# Patient Record
Sex: Female | Born: 1945 | Race: White | Hispanic: No | Marital: Married | Smoking: Former smoker
Health system: Southern US, Community
[De-identification: ages and names within clinical notes are randomized; demographics above are authoritative.]

## PROBLEM LIST (undated history)

## (undated) DIAGNOSIS — D649 Anemia, unspecified: Secondary | ICD-10-CM

## (undated) DIAGNOSIS — F32A Depression, unspecified: Secondary | ICD-10-CM

## (undated) DIAGNOSIS — R112 Nausea with vomiting, unspecified: Secondary | ICD-10-CM

## (undated) DIAGNOSIS — I2510 Atherosclerotic heart disease of native coronary artery without angina pectoris: Secondary | ICD-10-CM

## (undated) DIAGNOSIS — C50919 Malignant neoplasm of unspecified site of unspecified female breast: Secondary | ICD-10-CM

## (undated) DIAGNOSIS — E039 Hypothyroidism, unspecified: Secondary | ICD-10-CM

## (undated) DIAGNOSIS — Z889 Allergy status to unspecified drugs, medicaments and biological substances status: Secondary | ICD-10-CM

## (undated) DIAGNOSIS — Z9889 Other specified postprocedural states: Secondary | ICD-10-CM

## (undated) DIAGNOSIS — I10 Essential (primary) hypertension: Secondary | ICD-10-CM

## (undated) DIAGNOSIS — M1990 Unspecified osteoarthritis, unspecified site: Secondary | ICD-10-CM

## (undated) DIAGNOSIS — E785 Hyperlipidemia, unspecified: Secondary | ICD-10-CM

## (undated) DIAGNOSIS — F329 Major depressive disorder, single episode, unspecified: Secondary | ICD-10-CM

## (undated) HISTORY — PX: ORIF WRIST FRACTURE: SHX2133

## (undated) HISTORY — PX: LAPAROSCOPY: SHX197

## (undated) HISTORY — DX: Hyperlipidemia, unspecified: E78.5

## (undated) HISTORY — DX: Atherosclerotic heart disease of native coronary artery without angina pectoris: I25.10

## (undated) HISTORY — PX: KNEE SURGERY: SHX244

## (undated) HISTORY — PX: TONSILLECTOMY: SUR1361

## (undated) HISTORY — PX: ABDOMINAL HYSTERECTOMY: SHX81

## (undated) HISTORY — DX: Malignant neoplasm of unspecified site of unspecified female breast: C50.919

---

## 1999-05-28 ENCOUNTER — Ambulatory Visit (HOSPITAL_BASED_OUTPATIENT_CLINIC_OR_DEPARTMENT_OTHER): Admission: RE | Admit: 1999-05-28 | Discharge: 1999-05-28 | Payer: Self-pay | Admitting: Orthopedic Surgery

## 1999-12-16 ENCOUNTER — Other Ambulatory Visit: Admission: RE | Admit: 1999-12-16 | Discharge: 1999-12-16 | Payer: Self-pay | Admitting: *Deleted

## 2000-03-23 ENCOUNTER — Encounter: Admission: RE | Admit: 2000-03-23 | Discharge: 2000-03-23 | Payer: Self-pay | Admitting: *Deleted

## 2000-03-23 ENCOUNTER — Encounter: Payer: Self-pay | Admitting: *Deleted

## 2001-01-17 ENCOUNTER — Other Ambulatory Visit: Admission: RE | Admit: 2001-01-17 | Discharge: 2001-01-17 | Payer: Self-pay | Admitting: *Deleted

## 2001-04-23 ENCOUNTER — Encounter: Admission: RE | Admit: 2001-04-23 | Discharge: 2001-04-23 | Payer: Self-pay | Admitting: Internal Medicine

## 2001-04-23 ENCOUNTER — Encounter: Payer: Self-pay | Admitting: Internal Medicine

## 2002-01-29 ENCOUNTER — Other Ambulatory Visit: Admission: RE | Admit: 2002-01-29 | Discharge: 2002-01-29 | Payer: Self-pay | Admitting: *Deleted

## 2002-04-24 ENCOUNTER — Encounter: Payer: Self-pay | Admitting: Internal Medicine

## 2002-04-24 ENCOUNTER — Encounter: Admission: RE | Admit: 2002-04-24 | Discharge: 2002-04-24 | Payer: Self-pay | Admitting: Internal Medicine

## 2003-02-06 ENCOUNTER — Other Ambulatory Visit: Admission: RE | Admit: 2003-02-06 | Discharge: 2003-02-06 | Payer: Self-pay | Admitting: *Deleted

## 2003-04-29 ENCOUNTER — Encounter: Admission: RE | Admit: 2003-04-29 | Discharge: 2003-04-29 | Payer: Self-pay | Admitting: Internal Medicine

## 2003-04-29 ENCOUNTER — Encounter: Payer: Self-pay | Admitting: Internal Medicine

## 2004-03-11 ENCOUNTER — Other Ambulatory Visit: Admission: RE | Admit: 2004-03-11 | Discharge: 2004-03-11 | Payer: Self-pay | Admitting: *Deleted

## 2004-04-29 ENCOUNTER — Encounter: Admission: RE | Admit: 2004-04-29 | Discharge: 2004-04-29 | Payer: Self-pay | Admitting: *Deleted

## 2004-09-15 ENCOUNTER — Ambulatory Visit (HOSPITAL_COMMUNITY): Admission: RE | Admit: 2004-09-15 | Discharge: 2004-09-15 | Payer: Self-pay | Admitting: Gastroenterology

## 2005-03-23 ENCOUNTER — Other Ambulatory Visit: Admission: RE | Admit: 2005-03-23 | Discharge: 2005-03-23 | Payer: Self-pay | Admitting: *Deleted

## 2005-05-02 ENCOUNTER — Encounter: Admission: RE | Admit: 2005-05-02 | Discharge: 2005-05-02 | Payer: Self-pay | Admitting: *Deleted

## 2006-04-25 ENCOUNTER — Other Ambulatory Visit: Admission: RE | Admit: 2006-04-25 | Discharge: 2006-04-25 | Payer: Self-pay | Admitting: *Deleted

## 2006-05-04 ENCOUNTER — Encounter: Admission: RE | Admit: 2006-05-04 | Discharge: 2006-05-04 | Payer: Self-pay | Admitting: Internal Medicine

## 2007-05-08 ENCOUNTER — Other Ambulatory Visit: Admission: RE | Admit: 2007-05-08 | Discharge: 2007-05-08 | Payer: Self-pay | Admitting: *Deleted

## 2007-05-09 ENCOUNTER — Encounter: Admission: RE | Admit: 2007-05-09 | Discharge: 2007-05-09 | Payer: Self-pay | Admitting: Internal Medicine

## 2008-05-08 ENCOUNTER — Emergency Department (HOSPITAL_COMMUNITY): Admission: EM | Admit: 2008-05-08 | Discharge: 2008-05-08 | Payer: Self-pay | Admitting: Emergency Medicine

## 2008-05-08 ENCOUNTER — Other Ambulatory Visit: Admission: RE | Admit: 2008-05-08 | Discharge: 2008-05-08 | Payer: Self-pay | Admitting: Gynecology

## 2008-05-13 ENCOUNTER — Encounter: Admission: RE | Admit: 2008-05-13 | Discharge: 2008-05-13 | Payer: Self-pay | Admitting: Internal Medicine

## 2008-05-14 ENCOUNTER — Ambulatory Visit (HOSPITAL_COMMUNITY): Admission: RE | Admit: 2008-05-14 | Discharge: 2008-05-15 | Payer: Self-pay | Admitting: Orthopaedic Surgery

## 2009-05-14 ENCOUNTER — Encounter: Admission: RE | Admit: 2009-05-14 | Discharge: 2009-05-14 | Payer: Self-pay | Admitting: Gynecology

## 2009-08-06 IMAGING — MG MM SCREEN MAMMOGRAM BILATERAL
4 series · 4 of 4 positions shown · non-contrast
Comparison: none

DG SCREEN MAMMOGRAM BILATERAL
Bilateral CC and MLO view(s) were taken.
Technologist: Shamil Jesus

DIGITAL SCREENING MAMMOGRAM WITH CAD:
There are scattered fibroglandular densities.  No masses or malignant type calcifications are 
identified.  Compared with prior studies.

[R CC]
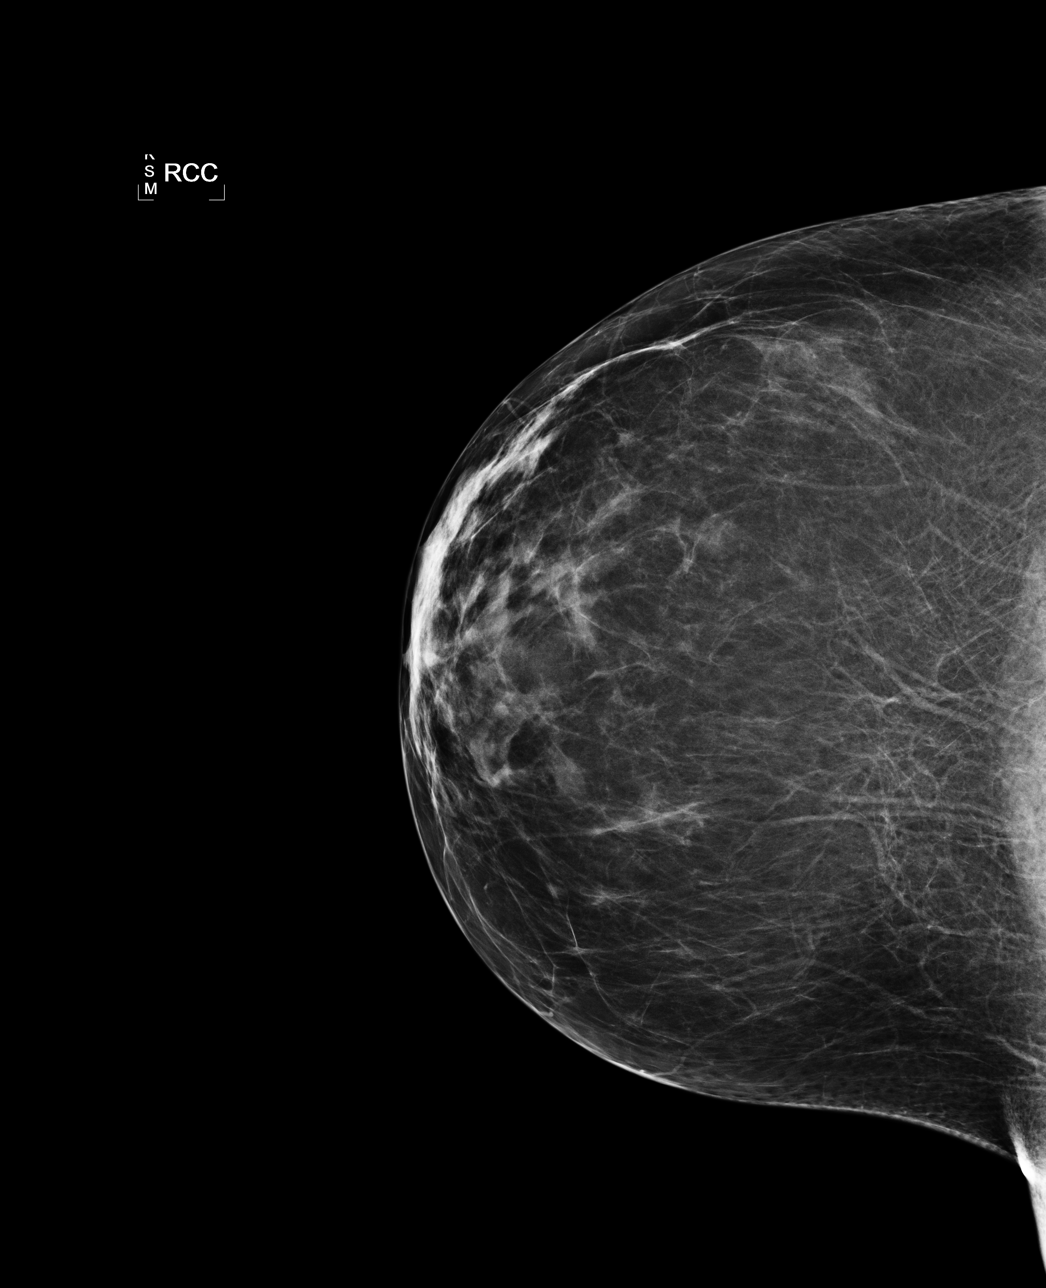

[L CC]
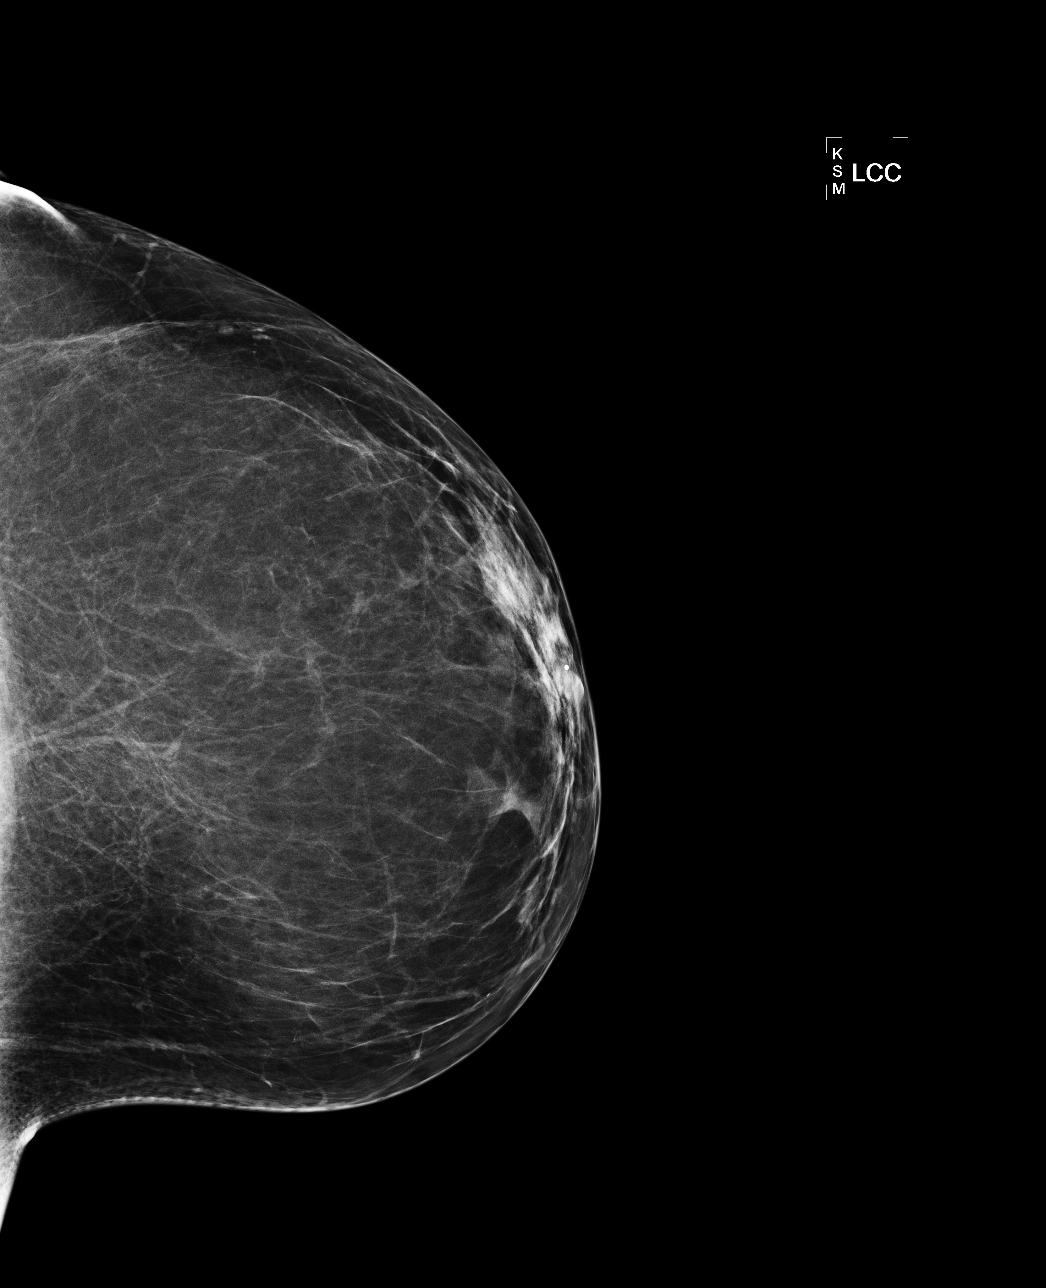

[L MLO]
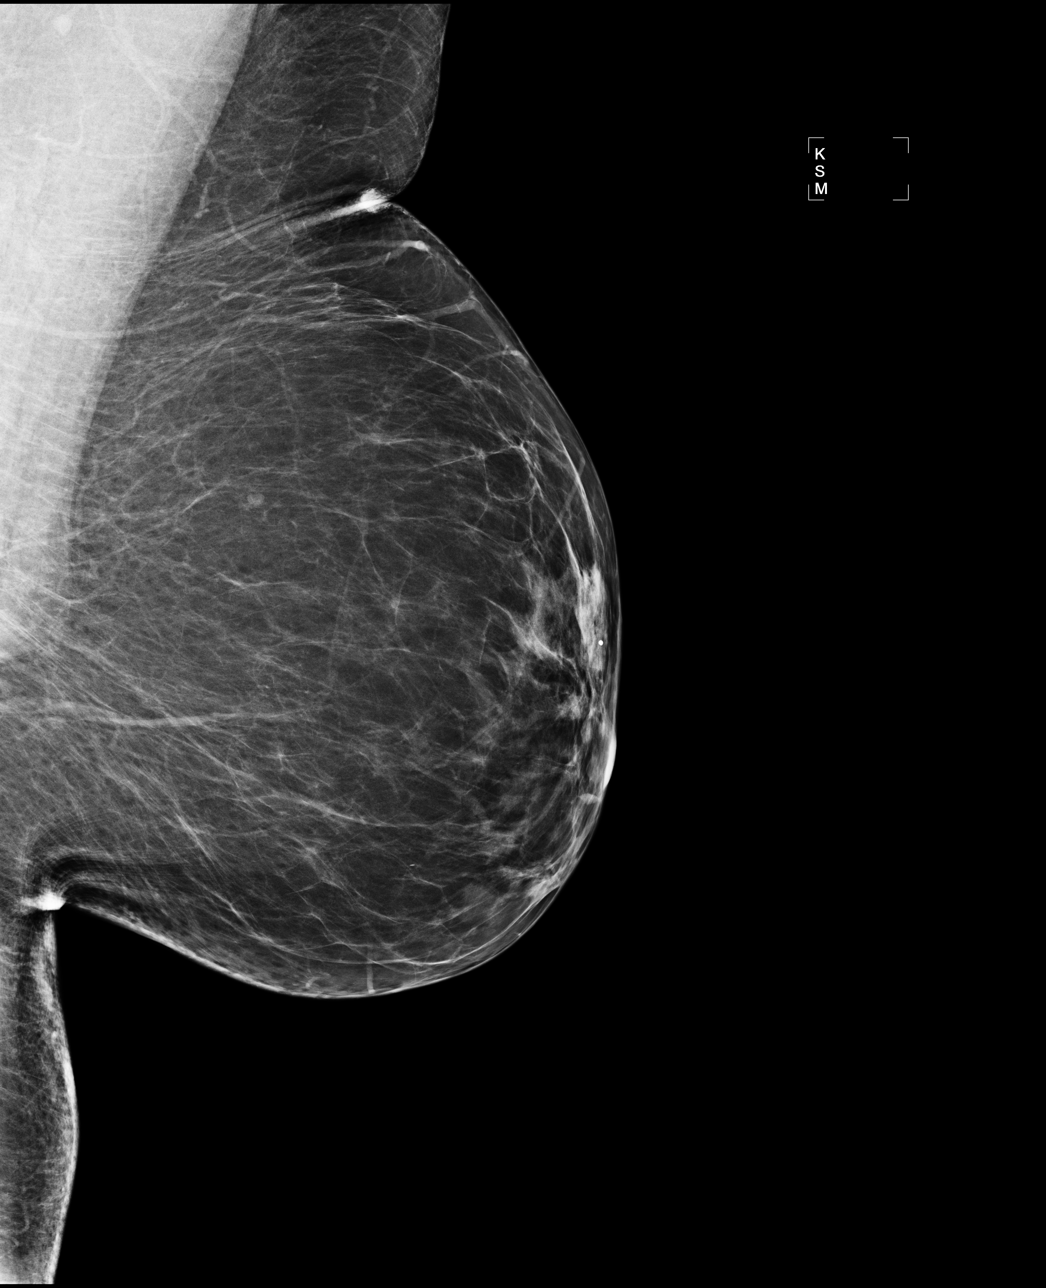

[R MLO]
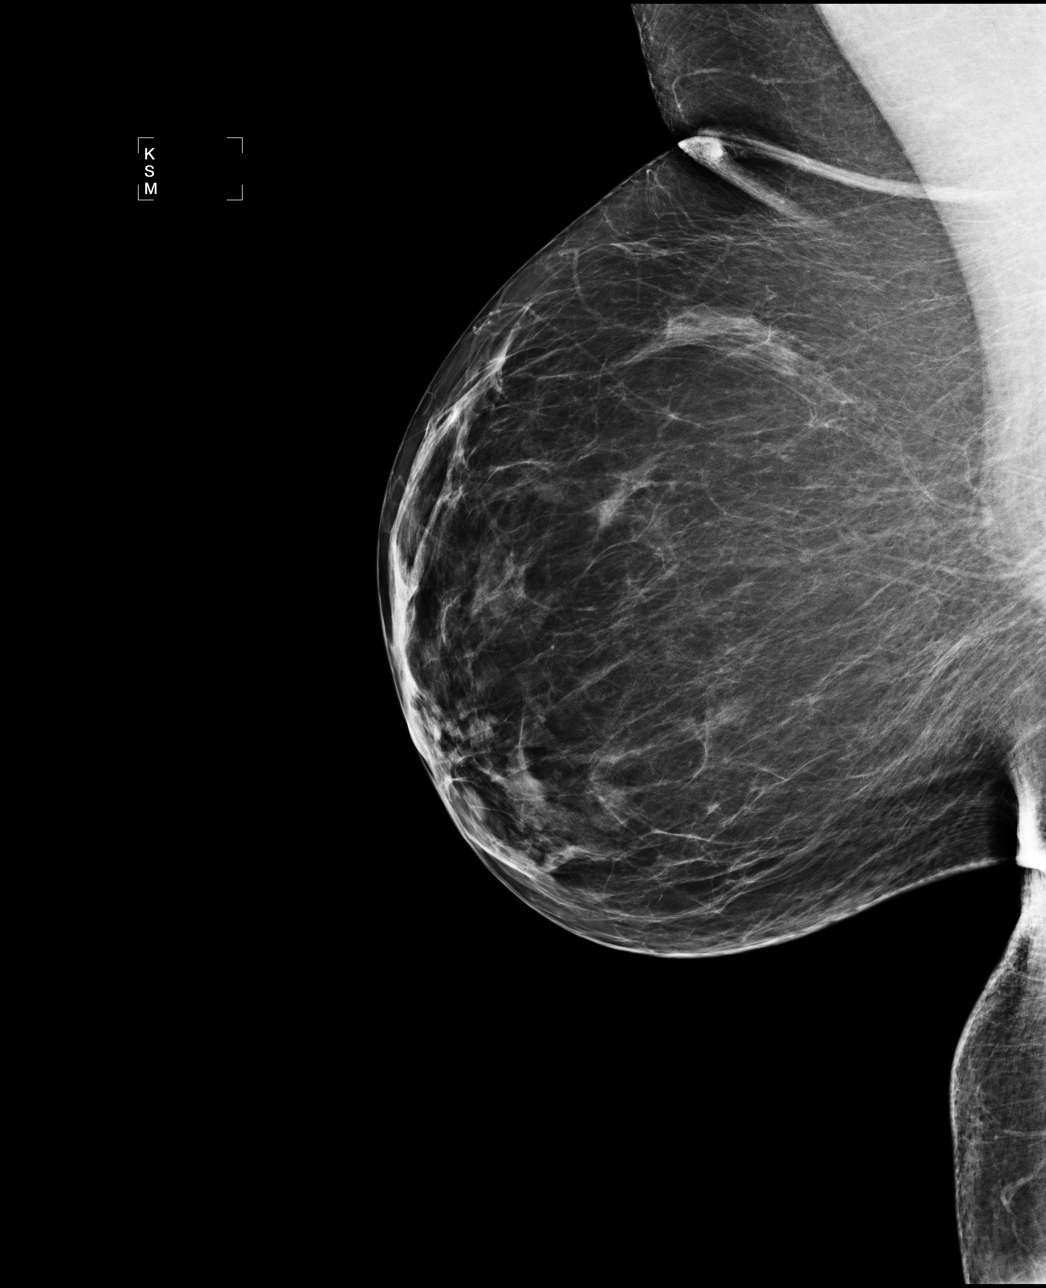

[4 of 4 positions shown; findings below may reference images not displayed]

IMPRESSION: No specific mammographic evidence of malignancy.  Next screening mammogram is recommended in one 
year.

ASSESSMENT: Negative - BI-RADS 1

Screening mammogram in 1 year.
ANALYZED BY COMPUTER AIDED DETECTION. , THIS PROCEDURE WAS A DIGITAL MAMMOGRAM.

## 2009-08-07 IMAGING — CR DG CHEST 2V
2 series · 2 of 2 positions shown · non-contrast
Comparison: None

CLINICAL DATA: Preop radiograph.

CHEST - 2 VIEW

[view not recorded (1 of 2)]
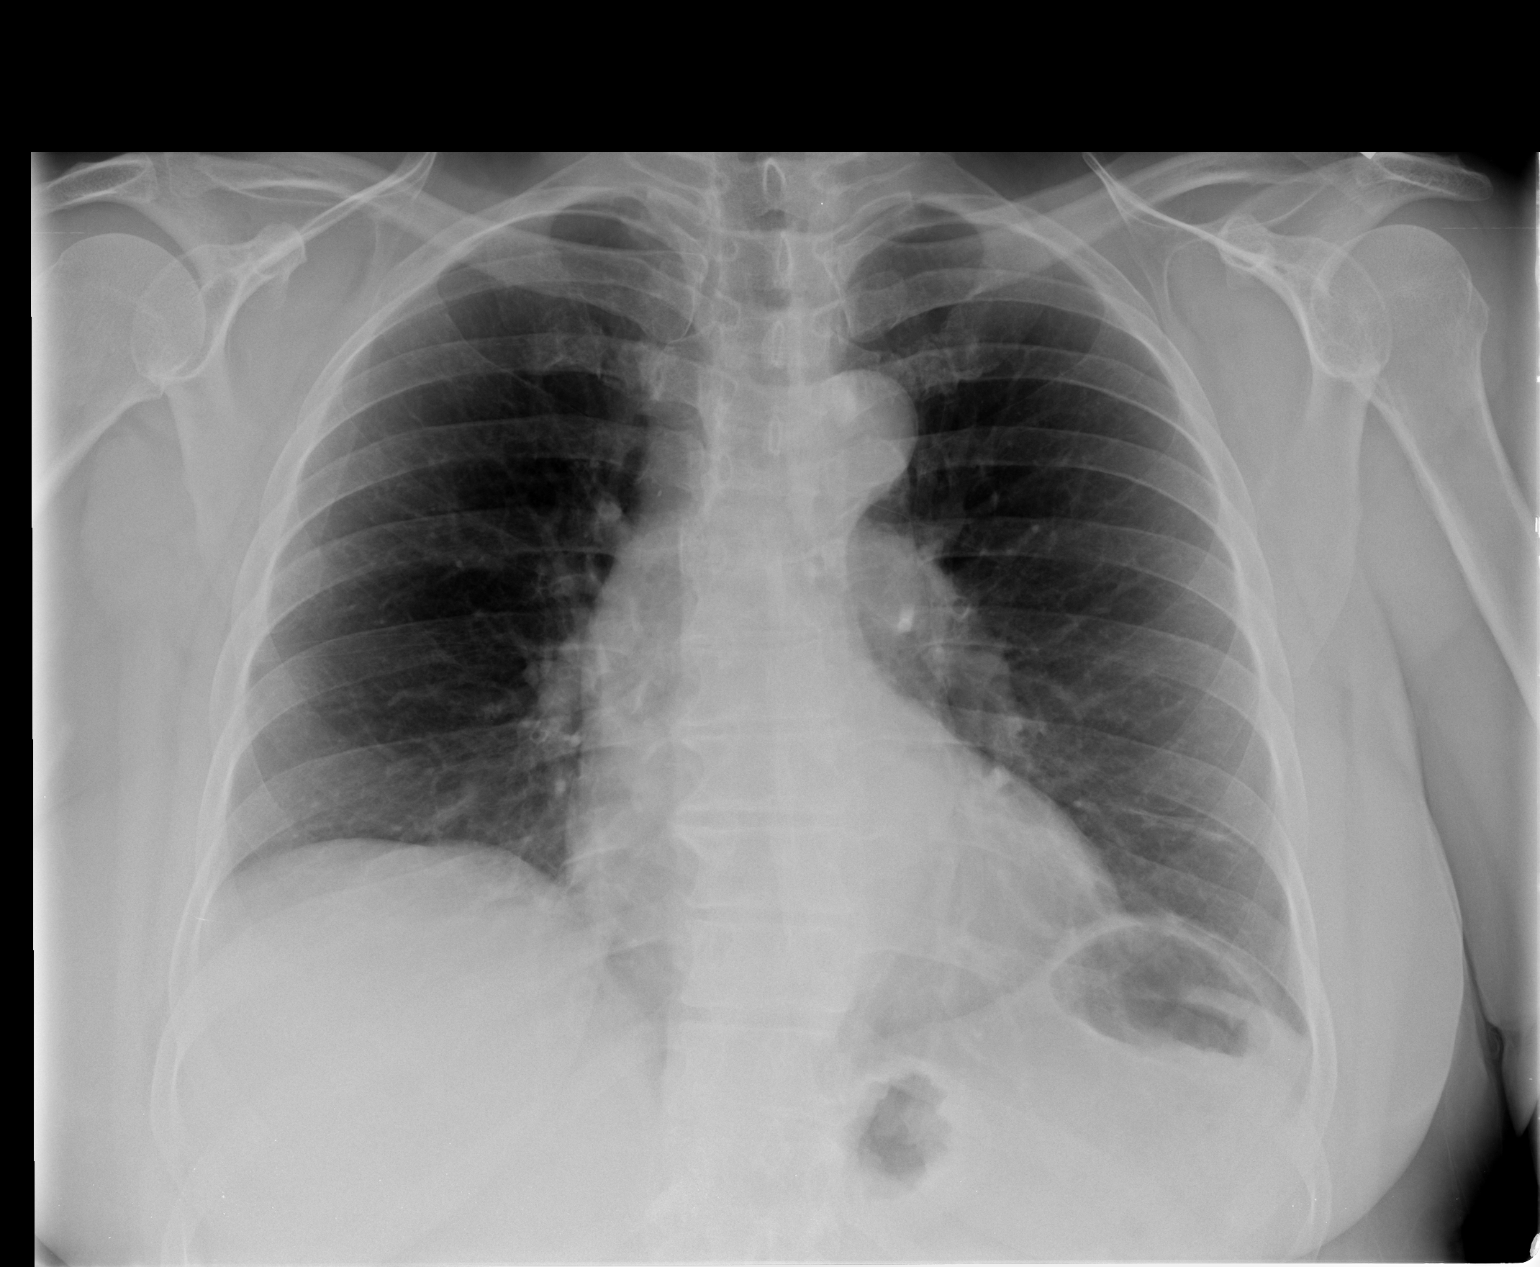

[view not recorded (2 of 2)]
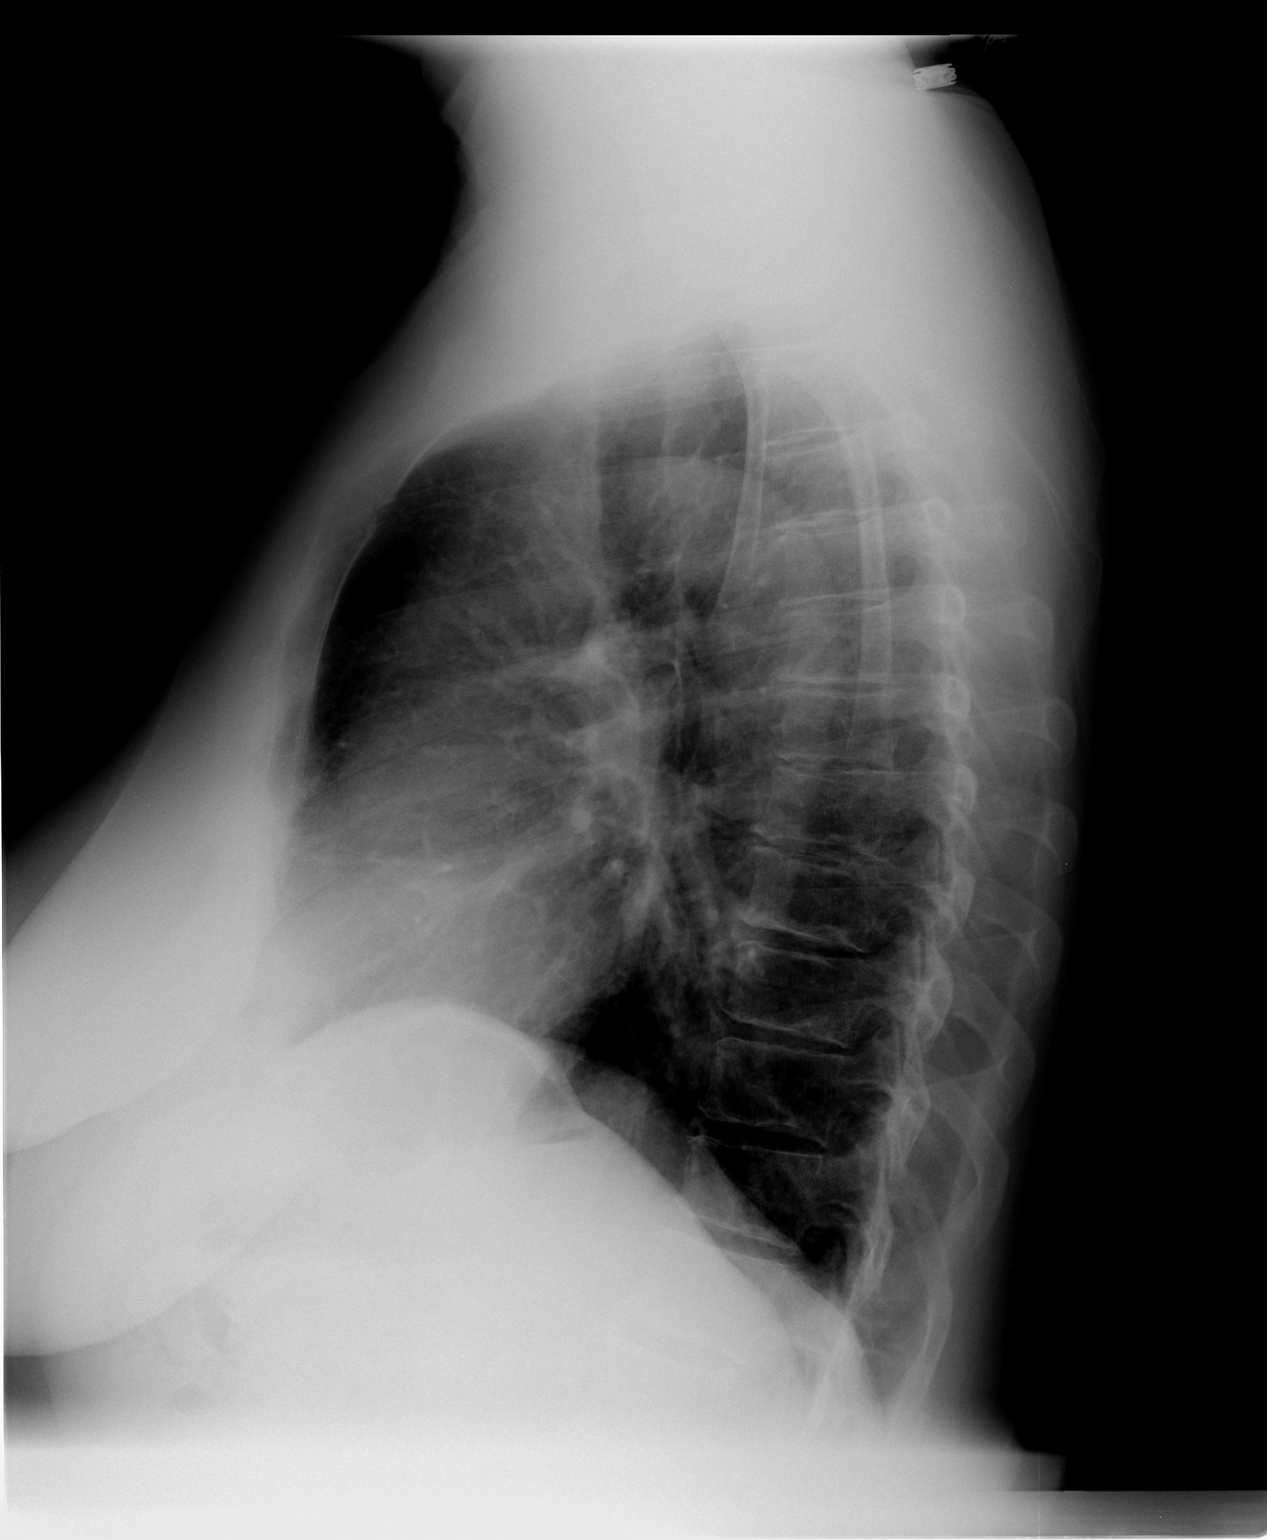

[2 of 2 positions shown; findings below may reference images not displayed]

FINDINGS: There is a mild scarring within the lingular portion of
the left lung.

Heart size and mediastinal contours are normal.

No pleural effusion or pulmonary interstitial edema.

No airspace densities identified.
IMPRESSION: 1.  Left base scarring.
2.  No acute findings the

## 2010-05-17 ENCOUNTER — Encounter: Admission: RE | Admit: 2010-05-17 | Discharge: 2010-05-17 | Payer: Self-pay | Admitting: Gynecology

## 2011-01-18 NOTE — Op Note (Signed)
NAME:  Meghan Hoffman, Meghan Hoffman NO.:  [id number]   MEDICAL RECORD NO.:  [id number]          PATIENT TYPE:  OIB   LOCATION:  5033                         FACILITY:  [facility]   PHYSICIAN:  Mark C. Ophelia Charter, M.D.    DATE OF BIRTH:  May 12, 1946   DATE OF PROCEDURE:  05/14/2008  DATE OF DISCHARGE:                               OPERATIVE REPORT   PREOPERATIVE DIAGNOSIS:  Comminuted right distal radius fracture.   POSTOPERATIVE DIAGNOSIS:  Comminuted right distal radius fracture.   ORIF right distal radius (more than 3 distal fragments).   SURGEON:  Mark C. Ophelia Charter, MD   ASSISTANT:  Wende Neighbors, PA-C   ANESTHESIA:  IV sedation with block.   TOURNIQUET TIME:  Less than an hour and half and deflated time is 15  minutes and up time is 30 minutes.   DRAINS:  None.   INDICATIONS:  This 65 year old female fell with severely comminuted  distal radius fracture with multiple fragments and dorsal comminution,  volar comminution, depression of the styloid fracture, and involvement  of the distal RU joint with the lunate fragment separate.  She is  brought in for operative stabilization.   PROCEDURE:  After induction of either scalene or axillary block  anesthesia, the patient had numbness in the right upper extremity.  She  received some IV sedation and proximal arm tourniquet was applied,  standard prep and drape was performed with DuraPrep.  Preoperative  antibiotics were given.  Time-out checklist procedure was completed.  Incision was made over the flexor carpi radialis splitting the sheath  posteriorly, pulling the pronator from the radial aspect toward the ulna  with subperiosteal section of the fracture site.  There was significant  comminution.  Reduction was performed.  Checking under fluoroscopy,  there was still some slight displacement of the styloid fragment.  K-  wire was used initially and then a second for reduction of plate was  initially fashioned, placed with  rotation to check spot pictures after  some distal row screws were inserted.  Styloid piece came loose and  there was depression of the articular fragment of the styloid.  Plate  screws were removed and bone graft was selected, 10 mL and packed  meticulously into the fracture site with distraction pushing the  articular surface fragments up.  This was cancellous bone chips.  With  improvement of the position of the articular surface, plate was  reapplied and a screw holes were then filled.  The distal screws had to  be redrilled and the distal-most row on the ulnar aspect screw would not  lock in completely and was left empty.  All of the screw holes were  filled and the styloid fragment was threaded, all of the pegs were  smooth.  Due to the comminution of the styloid, 2 percutaneous K-wires  were placed by stab incision, mosquito spreading and then placement of  the 2 K-wires for stabilization of the styloid piece.  Fluoroscopic spot  pictures were taken, which showed significant improvement in the  position and alignment.  There was restoration of the  tilt of the radius  and regional approximation within a mm of the distal articular surface  on AP x-ray.  After irrigation of saline solution, skin was closed with  3-0 nylon, pins were bent, skin was released.  Pin caps  applied.  Marcaine infiltration in the skin, 4x4s after Xeroform twiners  between the fingers, and then splint application with plaster, Webril,  and then Ace wrap.  The patient tolerated the procedure well and was  transferred to recovery room in stable condition.  Instrument count and  needle count was correct.      Mark C. Ophelia Charter, M.D.  Electronically Signed     MCY/MEDQ  D:  05/14/2008  T:  05/15/2008  Job:  [id number]

## 2011-01-21 NOTE — Op Note (Signed)
NAME:  KASHIA, BROSSARD NO.:  [id number]   MEDICAL RECORD NO.:  [id number]          PATIENT TYPE:  AMB   LOCATION:  ENDO                         FACILITY:  [facility]   PHYSICIAN:  Danise Edge, M.D.   DATE OF BIRTH:  [birth year]   DATE OF PROCEDURE:  09/15/2004  DATE OF DISCHARGE:                                 OPERATIVE REPORT   INDICATIONS FOR PROCEDURE:  Ms Haylei Cobin is a 65 year old female born  Mar 21, 1946.  Ms Klinker underwent a normal health maintenance flexible  proctosigmoidoscopy in 2000.  She is scheduled today to undergo her first  screening colonoscopy with polypectomy to prevent colon cancer.   ENDOSCOPIST:  Danise Edge, M.D.   PREMEDICATION:  Versed 7 mg, Demerol 70 mg.   PROCEDURE:  Colonoscopy.   DESCRIPTION OF PROCEDURE:  After obtaining informed consent, Ms Tritch was  placed in the left lateral decubitus position.  I administered intravenous  Demerol and intravenous Versed to achieve conscious sedation for the  procedure.  The patient's blood pressure, oxygen saturation, and cardiac  rhythm were monitored throughout the procedure and documented in the medical  record.   Anal inspection and digital rectal exam were normal.  The Olympus 2000  pediatric colonoscope was introduced into the rectum and advanced to the  cecum.  Colonic preparation for the exam today was excellent.   Rectum:  Normal.   Sigmoid colon and descending colon:  Normal.   Splenic flexure:  Normal.   Transverse colon:  Normal.   Hepatic flexure:  Normal.   Ascending colon.  Normal.   Cecum and ileocecal valve:  Normal.  The appendiceal orifice and ileocecal  valve were identified.   ASSESSMENT:  Normal screening proctocolonoscopy to the cecum.      MJ/MEDQ  D:  09/15/2004  T:  09/15/2004  Job:  [id number]   cc:   Georgann Housekeeper, MD  [address]  [place]  [place] [address]  Fax: [phone]

## 2011-04-11 ENCOUNTER — Other Ambulatory Visit: Payer: Self-pay | Admitting: Gynecology

## 2011-04-11 DIAGNOSIS — Z1231 Encounter for screening mammogram for malignant neoplasm of breast: Secondary | ICD-10-CM

## 2011-05-23 ENCOUNTER — Ambulatory Visit
Admission: RE | Admit: 2011-05-23 | Discharge: 2011-05-23 | Disposition: A | Discharge status: Home / Self Care | Payer: Managed Care, Other (non HMO) | Source: Ambulatory Visit | Attending: Gynecology | Admitting: Gynecology

## 2011-05-23 DIAGNOSIS — Z1231 Encounter for screening mammogram for malignant neoplasm of breast: Secondary | ICD-10-CM

## 2011-06-08 LAB — COMPREHENSIVE METABOLIC PANEL
ALT: 23
AST: 29
Albumin: 3.4 — ABNORMAL LOW
CO2: 31
Calcium: 9
Chloride: 102
Creatinine, Ser: 0.64
GFR calc Af Amer: >60
GFR calc non Af Amer: >60
Sodium: 139

## 2011-06-08 LAB — CBC
MCHC: 33.8
Platelets: 221
RBC: 4.19
WBC: 4.6

## 2011-06-08 LAB — APTT: aPTT: 31

## 2011-09-22 ENCOUNTER — Other Ambulatory Visit: Payer: Self-pay | Admitting: Internal Medicine

## 2011-09-22 DIAGNOSIS — I714 Abdominal aortic aneurysm, without rupture: Secondary | ICD-10-CM

## 2011-09-23 ENCOUNTER — Ambulatory Visit
Admission: RE | Admit: 2011-09-23 | Discharge: 2011-09-23 | Disposition: A | Discharge status: Home / Self Care | Payer: Managed Care, Other (non HMO) | Source: Ambulatory Visit | Attending: Internal Medicine | Admitting: Internal Medicine

## 2011-09-23 DIAGNOSIS — I714 Abdominal aortic aneurysm, without rupture: Secondary | ICD-10-CM

## 2011-10-25 ENCOUNTER — Other Ambulatory Visit: Payer: Self-pay | Admitting: Internal Medicine

## 2011-10-25 DIAGNOSIS — N281 Cyst of kidney, acquired: Secondary | ICD-10-CM

## 2012-03-26 ENCOUNTER — Ambulatory Visit
Admission: RE | Admit: 2012-03-26 | Discharge: 2012-03-26 | Disposition: A | Discharge status: Home / Self Care | Payer: Medicare Other | Source: Ambulatory Visit | Attending: Internal Medicine | Admitting: Internal Medicine

## 2012-03-26 DIAGNOSIS — N281 Cyst of kidney, acquired: Secondary | ICD-10-CM

## 2012-05-04 ENCOUNTER — Other Ambulatory Visit: Payer: Self-pay | Admitting: Internal Medicine

## 2012-05-04 DIAGNOSIS — Z1231 Encounter for screening mammogram for malignant neoplasm of breast: Secondary | ICD-10-CM

## 2012-05-23 ENCOUNTER — Ambulatory Visit
Admission: RE | Admit: 2012-05-23 | Discharge: 2012-05-23 | Disposition: A | Discharge status: Home / Self Care | Payer: Medicare Other | Source: Ambulatory Visit | Attending: Internal Medicine | Admitting: Internal Medicine

## 2012-05-23 DIAGNOSIS — Z1231 Encounter for screening mammogram for malignant neoplasm of breast: Secondary | ICD-10-CM

## 2012-11-01 ENCOUNTER — Other Ambulatory Visit: Payer: Self-pay | Admitting: Internal Medicine

## 2012-11-01 DIAGNOSIS — N281 Cyst of kidney, acquired: Secondary | ICD-10-CM

## 2013-04-02 ENCOUNTER — Ambulatory Visit
Admission: RE | Admit: 2013-04-02 | Discharge: 2013-04-02 | Disposition: A | Discharge status: Home / Self Care | Payer: Medicare Other | Source: Ambulatory Visit | Attending: Internal Medicine | Admitting: Internal Medicine

## 2013-04-02 DIAGNOSIS — N281 Cyst of kidney, acquired: Secondary | ICD-10-CM

## 2013-05-07 ENCOUNTER — Other Ambulatory Visit: Payer: Self-pay

## 2013-05-07 DIAGNOSIS — Z1231 Encounter for screening mammogram for malignant neoplasm of breast: Secondary | ICD-10-CM

## 2013-05-27 ENCOUNTER — Ambulatory Visit
Admission: RE | Admit: 2013-05-27 | Discharge: 2013-05-27 | Disposition: A | Discharge status: Home / Self Care | Payer: Medicare Other | Source: Ambulatory Visit

## 2013-05-27 DIAGNOSIS — Z1231 Encounter for screening mammogram for malignant neoplasm of breast: Secondary | ICD-10-CM

## 2014-05-05 ENCOUNTER — Other Ambulatory Visit: Payer: Self-pay

## 2014-05-05 DIAGNOSIS — Z1231 Encounter for screening mammogram for malignant neoplasm of breast: Secondary | ICD-10-CM

## 2014-05-28 ENCOUNTER — Ambulatory Visit
Admission: RE | Admit: 2014-05-28 | Discharge: 2014-05-28 | Disposition: A | Discharge status: Home / Self Care | Payer: Medicare Other | Source: Ambulatory Visit

## 2014-05-28 ENCOUNTER — Encounter (INDEPENDENT_AMBULATORY_CARE_PROVIDER_SITE_OTHER): Payer: Self-pay

## 2014-05-28 DIAGNOSIS — Z1231 Encounter for screening mammogram for malignant neoplasm of breast: Secondary | ICD-10-CM

## 2014-09-17 ENCOUNTER — Other Ambulatory Visit: Payer: Self-pay | Admitting: Gastroenterology

## 2014-11-04 ENCOUNTER — Other Ambulatory Visit: Payer: Self-pay | Admitting: Gastroenterology

## 2014-11-14 ENCOUNTER — Encounter (HOSPITAL_COMMUNITY): Payer: Self-pay | Admitting: *Deleted

## 2014-11-24 ENCOUNTER — Ambulatory Visit (HOSPITAL_COMMUNITY): Payer: Medicare Other | Admitting: Anesthesiology

## 2014-11-24 ENCOUNTER — Encounter (HOSPITAL_COMMUNITY)
Admission: RE | Disposition: A | Discharge status: Home / Self Care | Payer: Self-pay | Source: Ambulatory Visit | Attending: Gastroenterology

## 2014-11-24 ENCOUNTER — Ambulatory Visit (HOSPITAL_COMMUNITY)
Admission: RE | Admit: 2014-11-24 | Discharge: 2014-11-24 | Disposition: A | Discharge status: Home / Self Care | Payer: Medicare Other | Source: Ambulatory Visit | Attending: Gastroenterology | Admitting: Gastroenterology

## 2014-11-24 ENCOUNTER — Encounter (HOSPITAL_COMMUNITY): Payer: Self-pay

## 2014-11-24 DIAGNOSIS — Z9079 Acquired absence of other genital organ(s): Secondary | ICD-10-CM | POA: Diagnosis not present

## 2014-11-24 DIAGNOSIS — D123 Benign neoplasm of transverse colon: Secondary | ICD-10-CM | POA: Diagnosis not present

## 2014-11-24 DIAGNOSIS — Z90710 Acquired absence of both cervix and uterus: Secondary | ICD-10-CM | POA: Insufficient documentation

## 2014-11-24 DIAGNOSIS — I10 Essential (primary) hypertension: Secondary | ICD-10-CM | POA: Insufficient documentation

## 2014-11-24 DIAGNOSIS — Z1211 Encounter for screening for malignant neoplasm of colon: Secondary | ICD-10-CM | POA: Diagnosis not present

## 2014-11-24 DIAGNOSIS — Z90722 Acquired absence of ovaries, bilateral: Secondary | ICD-10-CM | POA: Diagnosis not present

## 2014-11-24 DIAGNOSIS — D122 Benign neoplasm of ascending colon: Secondary | ICD-10-CM | POA: Insufficient documentation

## 2014-11-24 DIAGNOSIS — K5730 Diverticulosis of large intestine without perforation or abscess without bleeding: Secondary | ICD-10-CM | POA: Insufficient documentation

## 2014-11-24 DIAGNOSIS — E780 Pure hypercholesterolemia: Secondary | ICD-10-CM | POA: Insufficient documentation

## 2014-11-24 DIAGNOSIS — E039 Hypothyroidism, unspecified: Secondary | ICD-10-CM | POA: Insufficient documentation

## 2014-11-24 HISTORY — DX: Unspecified osteoarthritis, unspecified site: M19.90

## 2014-11-24 HISTORY — DX: Other specified postprocedural states: R11.2

## 2014-11-24 HISTORY — DX: Depression, unspecified: F32.A

## 2014-11-24 HISTORY — DX: Essential (primary) hypertension: I10

## 2014-11-24 HISTORY — DX: Allergy status to unspecified drugs, medicaments and biological substances: Z88.9

## 2014-11-24 HISTORY — DX: Nausea with vomiting, unspecified: Z98.890

## 2014-11-24 HISTORY — DX: Anemia, unspecified: D64.9

## 2014-11-24 HISTORY — DX: Hypothyroidism, unspecified: E03.9

## 2014-11-24 HISTORY — DX: Major depressive disorder, single episode, unspecified: F32.9

## 2014-11-24 HISTORY — PX: COLONOSCOPY WITH PROPOFOL: SHX5780

## 2014-11-24 SURGERY — COLONOSCOPY WITH PROPOFOL
Anesthesia: Monitor Anesthesia Care

## 2014-11-24 MED ORDER — SODIUM CHLORIDE 0.9 % IV SOLN: INTRAVENOUS | Status: DC

## 2014-11-24 MED ORDER — SODIUM CHLORIDE 0.9 % IV SOLN
INTRAVENOUS | Status: DC
Start: 2014-11-24 — End: 2014-11-24

## 2014-11-24 MED ORDER — LACTATED RINGERS IV SOLN
INTRAVENOUS | Status: DC
  Administered 2014-11-24: 1000 mL via INTRAVENOUS

## 2014-11-24 MED ORDER — PROPOFOL 10 MG/ML IV BOLUS
INTRAVENOUS | Status: AC
  Filled 2014-11-24: qty 20

## 2014-11-24 MED ORDER — PROPOFOL INFUSION 10 MG/ML OPTIME
INTRAVENOUS | Status: DC | PRN
  Administered 2014-11-24: 140 ug/kg/min via INTRAVENOUS

## 2014-11-24 SURGICAL SUPPLY — 22 items

## 2014-11-24 NOTE — Anesthesia Postprocedure Evaluation (Signed)
Anesthesia Post Note  Patient: Meghan Hoffman  Procedure(s) Performed: Procedure(s) (LRB): COLONOSCOPY WITH PROPOFOL (N/A)  Anesthesia type: general  Patient location: PACU  Post pain: Pain level controlled  Post assessment: Patient's Cardiovascular Status Stable  Last Vitals:  Filed Vitals:   11/24/14 0910  BP: 144/77  Pulse: 44  Temp:   Resp: 12    Post vital signs: Reviewed and stable  Level of consciousness: sedated  Complications: No apparent anesthesia complications

## 2014-11-24 NOTE — Discharge Instructions (Signed)
Colonoscopy, Care After °These instructions give you information on caring for yourself after your procedure. Your doctor may also give you more specific instructions. Call your doctor if you have any problems or questions after your procedure. °HOME CARE °· Do not drive for 24 hours. °· Do not sign important papers or use machinery for 24 hours. °· You may shower. °· You may go back to your usual activities, but go slower for the first 24 hours. °· Take rest breaks often during the first 24 hours. °· Walk around or use warm packs on your belly (abdomen) if you have belly cramping or gas. °· Drink enough fluids to keep your pee (urine) clear or pale yellow. °· Resume your normal diet. Avoid heavy or fried foods. °· Avoid drinking alcohol for 24 hours or as told by your doctor. °· Only take medicines as told by your doctor. °If a tissue sample (biopsy) was taken during the procedure:  °· Do not take aspirin or blood thinners for 7 days, or as told by your doctor. °· Do not drink alcohol for 7 days, or as told by your doctor. °· Eat soft foods for the first 24 hours. °GET HELP IF: °You still have a small amount of blood in your poop (stool) 2-3 days after the procedure. °GET HELP RIGHT AWAY IF: °· You have more than a small amount of blood in your poop. °· You see clumps of tissue (blood clots) in your poop. °· Your belly is puffy (swollen). °· You feel sick to your stomach (nauseous) or throw up (vomit). °· You have a fever. °· You have belly pain that gets worse and medicine does not help. °MAKE SURE YOU: °· Understand these instructions. °· Will watch your condition. °· Will get help right away if you are not doing well or get worse. °Document Released: 09/24/2010 Document Revised: 08/27/2013 Document Reviewed: 04/29/2013 °ExitCare® Patient Information ©2015 [employer]. This information is not intended to replace advice given to you by your health care provider. Make sure you discuss any questions you have with  your health care provider. ° °

## 2014-11-24 NOTE — Anesthesia Preprocedure Evaluation (Signed)
Anesthesia Evaluation  Patient identified by MRN, date of birth, ID band Patient awake    Reviewed: Allergy & Precautions, NPO status , Patient's Chart, lab work & pertinent test results  History of Anesthesia Complications (+) PONV  Airway Mallampati: I  TM Distance: >3 FB Neck ROM: Full    Dental   Pulmonary former smoker,          Cardiovascular hypertension, Pt. on medications     Neuro/Psych    GI/Hepatic   Endo/Other    Renal/GU      Musculoskeletal   Abdominal   Peds  Hematology   Anesthesia Other Findings   Reproductive/Obstetrics                             Anesthesia Physical Anesthesia Plan  ASA: II  Anesthesia Plan: MAC   Post-op Pain Management:    Induction: Intravenous  Airway Management Planned: Natural Airway  Additional Equipment:   Intra-op Plan:   Post-operative Plan:   Informed Consent: I have reviewed the patients History and Physical, chart, labs and discussed the procedure including the risks, benefits and alternatives for the proposed anesthesia with the patient or authorized representative who has indicated his/her understanding and acceptance.     Plan Discussed with: CRNA and Surgeon  Anesthesia Plan Comments:         Anesthesia Quick Evaluation

## 2014-11-24 NOTE — Transfer of Care (Signed)
Immediate Anesthesia Transfer of Care Note  Patient: Meghan Hoffman  Procedure(s) Performed: Procedure(s): COLONOSCOPY WITH PROPOFOL (N/A)  Patient Location: PACU  Anesthesia Type:MAC  Level of Consciousness: sedated  Airway & Oxygen Therapy: Patient Spontanous Breathing and Patient connected to face mask oxygen  Post-op Assessment: Report given to RN and Post -op Vital signs reviewed and stable  Post vital signs: Reviewed and stable  Last Vitals:  Filed Vitals:   11/24/14 0747  BP: 156/71  Pulse: 55  Temp: 36.6 C  Resp: 13    Complications: No apparent anesthesia complications

## 2014-11-24 NOTE — Op Note (Signed)
Procedure: Screening colonoscopy. Normal baseline screening colonoscopy performed on 09/15/2004  Endoscopist: Earle Gell  Premedication: Propofol administered by anesthesia  Procedure: The patient was placed in the left lateral decubitus position. Anal inspection and digital rectal exam normal. The Pentax pediatric colonoscope was introduced into the rectum and advanced to the cecum. A normal-appearing appendiceal orifice was identified. A normal-appearing ileocecal valve was identified. Colonic preparation for the exam today was good. Withdrawal time was 18 minutes  Rectum. Normal. Retroflexed view of the distal rectum normal  Sigmoid colon and descending colon. Left colonic diverticulosis  Splenic flexure. Normal  Transverse colon. From the mid transverse colon, a 5 mm sessile polyp on a mucosal fold was removed with the electrocautery snare.  Hepatic flexure. Normal  Ascending colon. From the proximal ascending colon, an 8 mm sessile polyp was removed with the electrocautery snare  Cecum and ileocecal valve. Normal  Assessment:  #1. A small polyp was removed from the proximal ascending colon with the electrocautery snare and a small polyp was removed from the mid transverse colon with the electrocautery snare  #2. Left colonic diverticulosis  Recommendation: If the polyps return adenomatous pathologically, the patient should go a surveillance colonoscopy in 5 years

## 2014-11-24 NOTE — H&P (Signed)
  Procedure: Screening colonoscopy. Normal baseline screening colonoscopy performed on 09/15/2004  History: The patient is a 69 year old female born 12-28-1945. She is scheduled to undergo a repeat screening colonoscopy today.  Medication allergies: None  Past medical history: Osteopenia complicated by bilateral wrist fractures. Hypothyroidism. Hypertension. Microscopic hematuria. Left eye cataract. Morton's neuroma. Pernicious anemia. Hypercholesterolemia. Tonsillectomy. Appendectomy. TAH-BSO. Ovarian cystectomy. Carpal tunnel release surgery.  Exam: The patient is alert and lying comfortably on the endoscopy stretcher. Abdomen is soft and nontender to palpation. Lungs are clear to auscultation. Cardiac exam reveals regular rhythm.  Plan: Proceed with screening colonoscopy

## 2014-11-25 ENCOUNTER — Encounter (HOSPITAL_COMMUNITY): Payer: Self-pay | Admitting: Gastroenterology

## 2014-11-26 ENCOUNTER — Other Ambulatory Visit (HOSPITAL_COMMUNITY): Payer: Self-pay | Admitting: Internal Medicine

## 2014-11-26 DIAGNOSIS — R011 Cardiac murmur, unspecified: Secondary | ICD-10-CM

## 2014-12-19 ENCOUNTER — Other Ambulatory Visit (HOSPITAL_COMMUNITY): Payer: Self-pay | Admitting: Internal Medicine

## 2015-01-05 ENCOUNTER — Ambulatory Visit (HOSPITAL_COMMUNITY): Payer: Medicare Other

## 2015-01-05 ENCOUNTER — Ambulatory Visit (HOSPITAL_COMMUNITY)
Admission: RE | Admit: 2015-01-05 | Discharge: 2015-01-05 | Disposition: A | Discharge status: Home / Self Care | Payer: Medicare Other | Source: Ambulatory Visit | Attending: Cardiovascular Disease | Admitting: Cardiovascular Disease

## 2015-01-05 DIAGNOSIS — E785 Hyperlipidemia, unspecified: Secondary | ICD-10-CM | POA: Insufficient documentation

## 2015-01-05 DIAGNOSIS — I10 Essential (primary) hypertension: Secondary | ICD-10-CM | POA: Diagnosis not present

## 2015-01-05 DIAGNOSIS — R011 Cardiac murmur, unspecified: Secondary | ICD-10-CM | POA: Diagnosis not present

## 2015-05-18 ENCOUNTER — Other Ambulatory Visit: Payer: Self-pay

## 2015-05-18 DIAGNOSIS — Z1231 Encounter for screening mammogram for malignant neoplasm of breast: Secondary | ICD-10-CM

## 2015-06-09 ENCOUNTER — Ambulatory Visit
Admission: RE | Admit: 2015-06-09 | Discharge: 2015-06-09 | Disposition: A | Discharge status: Home / Self Care | Payer: Medicare Other | Source: Ambulatory Visit

## 2015-06-09 DIAGNOSIS — Z1231 Encounter for screening mammogram for malignant neoplasm of breast: Secondary | ICD-10-CM

## 2016-05-05 ENCOUNTER — Other Ambulatory Visit: Payer: Self-pay | Admitting: Internal Medicine

## 2016-05-05 DIAGNOSIS — Z1231 Encounter for screening mammogram for malignant neoplasm of breast: Secondary | ICD-10-CM

## 2016-06-09 ENCOUNTER — Ambulatory Visit
Admission: RE | Admit: 2016-06-09 | Discharge: 2016-06-09 | Disposition: A | Discharge status: Home / Self Care | Payer: Medicare Other | Source: Ambulatory Visit | Attending: Internal Medicine | Admitting: Internal Medicine

## 2016-06-09 DIAGNOSIS — Z1231 Encounter for screening mammogram for malignant neoplasm of breast: Secondary | ICD-10-CM

## 2017-06-12 ENCOUNTER — Other Ambulatory Visit: Payer: Self-pay | Admitting: Internal Medicine

## 2017-06-12 DIAGNOSIS — Z1231 Encounter for screening mammogram for malignant neoplasm of breast: Secondary | ICD-10-CM

## 2017-06-23 ENCOUNTER — Ambulatory Visit
Admission: RE | Admit: 2017-06-23 | Discharge: 2017-06-23 | Disposition: A | Discharge status: Home / Self Care | Payer: Medicare Other | Source: Ambulatory Visit | Attending: Internal Medicine | Admitting: Internal Medicine

## 2017-06-23 DIAGNOSIS — Z1231 Encounter for screening mammogram for malignant neoplasm of breast: Secondary | ICD-10-CM

## 2018-05-21 ENCOUNTER — Other Ambulatory Visit: Payer: Self-pay | Admitting: Internal Medicine

## 2018-05-21 DIAGNOSIS — Z1231 Encounter for screening mammogram for malignant neoplasm of breast: Secondary | ICD-10-CM

## 2018-07-03 ENCOUNTER — Ambulatory Visit
Admission: RE | Admit: 2018-07-03 | Discharge: 2018-07-03 | Disposition: A | Discharge status: Home / Self Care | Payer: Medicare Other | Source: Ambulatory Visit | Attending: Internal Medicine | Admitting: Internal Medicine

## 2018-07-03 DIAGNOSIS — Z1231 Encounter for screening mammogram for malignant neoplasm of breast: Secondary | ICD-10-CM

## 2019-06-11 ENCOUNTER — Other Ambulatory Visit: Payer: Self-pay | Admitting: Internal Medicine

## 2019-06-11 DIAGNOSIS — Z1231 Encounter for screening mammogram for malignant neoplasm of breast: Secondary | ICD-10-CM

## 2019-07-18 ENCOUNTER — Ambulatory Visit: Payer: Medicare Other

## 2019-07-25 ENCOUNTER — Other Ambulatory Visit: Payer: Self-pay

## 2019-07-25 ENCOUNTER — Ambulatory Visit
Admission: RE | Admit: 2019-07-25 | Discharge: 2019-07-25 | Disposition: A | Discharge status: Home / Self Care | Payer: Medicare Other | Source: Ambulatory Visit | Attending: Internal Medicine | Admitting: Internal Medicine

## 2019-07-25 DIAGNOSIS — Z1231 Encounter for screening mammogram for malignant neoplasm of breast: Secondary | ICD-10-CM

## 2019-10-01 ENCOUNTER — Ambulatory Visit: Payer: Medicare Other

## 2019-10-10 ENCOUNTER — Ambulatory Visit: Payer: Medicare Other | Attending: Internal Medicine

## 2019-10-10 DIAGNOSIS — Z23 Encounter for immunization: Secondary | ICD-10-CM | POA: Insufficient documentation

## 2019-10-10 NOTE — Progress Notes (Signed)
   Covid-19 Vaccination Clinic  Name:  Meghan Hoffman    MRN: [id number] DOB: March 07, 1946  10/10/2019  Meghan Hoffman was observed post Covid-19 immunization for 15 minutes without incidence. She was provided with Vaccine Information Sheet and instruction to access the V-Safe system.   Meghan Hoffman was instructed to call 911 with any severe reactions post vaccine: Marland Kitchen Difficulty breathing  . Swelling of your face and throat  . A fast heartbeat  . A bad rash all over your body  . Dizziness and weakness    Immunizations Administered    Name Date Dose VIS Date Route   Pfizer COVID-19 Vaccine 10/10/2019  9:20 AM 0.3 mL 08/16/2019 Intramuscular   Manufacturer: [place]   Lot: [id number]   [place]: [id number]

## 2019-10-22 ENCOUNTER — Ambulatory Visit: Payer: Medicare Other

## 2019-11-04 ENCOUNTER — Ambulatory Visit: Payer: Medicare Other | Attending: Internal Medicine

## 2019-11-04 DIAGNOSIS — Z23 Encounter for immunization: Secondary | ICD-10-CM | POA: Insufficient documentation

## 2019-11-04 NOTE — Progress Notes (Signed)
   Covid-19 Vaccination Clinic  Name:  Meghan Hoffman    MRN: [id number] DOB: [birth year]  11/04/2019  Meghan Hoffman was observed post Covid-19 immunization for 15 minutes without incidence. She was provided with Vaccine Information Sheet and instruction to access the V-Safe system.   Meghan Hoffman was instructed to call 911 with any severe reactions post vaccine: Marland Kitchen Difficulty breathing  . Swelling of your face and throat  . A fast heartbeat  . A bad rash all over your body  . Dizziness and weakness    Immunizations Administered    Name Date Dose VIS Date Route   Pfizer COVID-19 Vaccine 11/04/2019  1:59 PM 0.3 mL 08/16/2019 Intramuscular   Manufacturer: [place]   Lot: [id number]   [place]: [id number]

## 2019-11-25 ENCOUNTER — Other Ambulatory Visit: Payer: Self-pay | Admitting: Obstetrics and Gynecology

## 2019-11-25 DIAGNOSIS — R0989 Other specified symptoms and signs involving the circulatory and respiratory systems: Secondary | ICD-10-CM

## 2019-11-26 ENCOUNTER — Ambulatory Visit
Admission: RE | Admit: 2019-11-26 | Discharge: 2019-11-26 | Disposition: A | Discharge status: Home / Self Care | Payer: Medicare Other | Source: Ambulatory Visit | Attending: Obstetrics and Gynecology | Admitting: Obstetrics and Gynecology

## 2019-11-26 DIAGNOSIS — R0989 Other specified symptoms and signs involving the circulatory and respiratory systems: Secondary | ICD-10-CM

## 2019-12-03 ENCOUNTER — Ambulatory Visit (INDEPENDENT_AMBULATORY_CARE_PROVIDER_SITE_OTHER): Payer: Medicare Other | Admitting: Vascular Surgery

## 2019-12-03 ENCOUNTER — Encounter: Payer: Self-pay | Admitting: Vascular Surgery

## 2019-12-03 ENCOUNTER — Other Ambulatory Visit: Payer: Self-pay

## 2019-12-03 VITALS — BP 138/83 | HR 66 | Temp 97.4°F | Resp 18 | Ht 65.0 in | Wt 171.9 lb

## 2019-12-03 DIAGNOSIS — R0989 Other specified symptoms and signs involving the circulatory and respiratory systems: Secondary | ICD-10-CM

## 2019-12-03 NOTE — Progress Notes (Signed)
Vascular and Vein Specialist of   Patient name: Meghan Hoffman MRN: [id number] DOB: 03-Dec-1945 Sex: female  REASON FOR CONSULT: Evaluation of carotid duplex  HPI: Meghan Hoffman is a 74 y.o. female, who is here today for discussion of recent carotid duplex.  She was found to have a carotid bruit and subsequently underwent duplex for further evaluation.  She is here today to discuss the results of this.  She specifically denies any prior history of amaurosis fugax, transient ischemic attack or stroke.  She reports that her mother suffered TIAs before her death and and and had a major stroke.  The patient denies any cardiac disease and is otherwise healthy  Past Medical History:  Diagnosis Date  . Anemia    "Pernicious Anemia"- injections every 3 weeks -next 11-25-14  . Arthritis   . Depression   . H/O seasonal allergies   . Hypertension   . Hypothyroidism   . PONV (postoperative nausea and vomiting)    once under general anesthesia    Family History  Problem Relation Age of Onset  . Breast cancer Maternal Aunt   . Breast cancer Paternal Grandmother     SOCIAL HISTORY: Social History   Socioeconomic History  . Marital status: Married    Spouse name: Not on file  . Number of children: Not on file  . Years of education: Not on file  . Highest education level: Not on file  Occupational History  . Not on file  Tobacco Use  . Smoking status: Former Smoker    Packs/day: 2.00    Years: 25.00    Pack years: 50.00    Types: Cigarettes    Quit date: 11/13/1997    Years since quitting: 22.0  . Smokeless tobacco: Never Used  Substance and Sexual Activity  . Alcohol use: Yes    Comment: 3 glasses wine daily  . Drug use: No  . Sexual activity: Not on file  Other Topics Concern  . Not on file  Social History Narrative  . Not on file   Social Determinants of Health   Financial Resource Strain:   . Difficulty of Paying  Living Expenses:   Food Insecurity:   . Worried About Charity fundraiser in the Last Year:   . Arboriculturist in the Last Year:   Transportation Needs:   . Film/video editor (Medical):   Marland Kitchen Lack of Transportation (Non-Medical):   Physical Activity:   . Days of Exercise per Week:   . Minutes of Exercise per Session:   Stress:   . Feeling of Stress :   Social Connections:   . Frequency of Communication with Friends and Family:   . Frequency of Social Gatherings with Friends and Family:   . Attends Religious Services:   . Active Member of Clubs or Organizations:   . Attends Archivist Meetings:   Marland Kitchen Marital Status:   Intimate Partner Violence:   . Fear of Current or Ex-Partner:   . Emotionally Abused:   Marland Kitchen Physically Abused:   . Sexually Abused:     No Known Allergies  Current Outpatient Medications  Medication Sig Dispense Refill  . atorvastatin (LIPITOR) 20 MG tablet Take 20 mg by mouth daily.    . B Complex Vitamins (VITAMIN B COMPLEX IJ) Inject as directed every 14 (fourteen) days.     . cholecalciferol (VITAMIN D) 1000 UNITS tablet Take 1,000 Units by mouth every evening.    Marland Kitchen  escitalopram (LEXAPRO) 10 MG tablet Take 10 mg by mouth daily.   11  . fexofenadine (ALLEGRA) 180 MG tablet Take 180 mg by mouth every evening.    . fluticasone (FLONASE) 50 MCG/ACT nasal spray Place 1 spray into both nostrils daily.    . hydrochlorothiazide (HYDRODIURIL) 25 MG tablet Take 12.5 mg by mouth every morning.    Marland Kitchen levothyroxine (SYNTHROID, LEVOTHROID) 125 MCG tablet Take 125 mcg by mouth daily before breakfast.   3  . magnesium gluconate (MAGONATE) 500 MG tablet Take 500 mg by mouth daily.    . potassium chloride (K-DUR,KLOR-CON) 10 MEQ tablet Take 10 mEq by mouth daily.    . TURMERIC PO Take 1 tablet by mouth 2 (two) times daily.     No current facility-administered medications for this visit.    REVIEW OF SYSTEMS:  [X]  denotes positive finding, [ ]  denotes negative  finding Cardiac  Comments:  Chest pain or chest pressure:    Shortness of breath upon exertion:    Short of breath when lying flat:    Irregular heart rhythm:        Vascular    Pain in calf, thigh, or hip brought on by ambulation:    Pain in feet at night that wakes you up from your sleep:     Blood clot in your veins:    Leg swelling:         Pulmonary    Oxygen at home:    Productive cough:     Wheezing:         Neurologic    Sudden weakness in arms or legs:     Sudden numbness in arms or legs:     Sudden onset of difficulty speaking or slurred speech:    Temporary loss of vision in one eye:     Problems with dizziness:         Gastrointestinal    Blood in stool:     Vomited blood:         Genitourinary    Burning when urinating:     Blood in urine:        Psychiatric    Major depression:         Hematologic    Bleeding problems:    Problems with blood clotting too easily:        Skin    Rashes or ulcers:        Constitutional    Fever or chills:      PHYSICAL EXAM: Vitals:   12/03/19 1347  BP: 138/83  Pulse: 66  Resp: 18  Temp: (!) 97.4 F (36.3 C)  TempSrc: Temporal  SpO2: 98%  Weight: 171 lb 14.4 oz (78 kg)  Height: 5\' 5"  (1.651 m)    GENERAL: The patient is a well-nourished female, in no acute distress. The vital signs are documented above. CARDIOVASCULAR: I do not hear a carotid bruit today.  She does have a soft systolic heart murmur PULMONARY: There is good air exchange  ABDOMEN: Soft and non-tender  MUSCULOSKELETAL: There are no major deformities or cyanosis. NEUROLOGIC: No focal weakness or paresthesias are detected. SKIN: There are no ulcers or rashes noted. PSYCHIATRIC: The patient has a normal affect.  DATA:  Carotid duplex at [facility] on 11/26/2019 reveals minimal plaque in her right carotid bifurcation and normal study on the left  MEDICAL ISSUES: I reviewed these findings with the patient.  Explained that this puts  her at no increased risk  for stroke with a near normal study.  I would not recommend follow-up study unless she develops symptoms.  She was reassured with this discussion will see [place] again as needed   Rosetta Posner, MD [facility] Specialists of [facility] Tel [phone] Pager [phone]

## 2020-04-13 ENCOUNTER — Other Ambulatory Visit: Payer: Self-pay | Admitting: Internal Medicine

## 2020-04-13 DIAGNOSIS — E785 Hyperlipidemia, unspecified: Secondary | ICD-10-CM

## 2020-04-16 ENCOUNTER — Other Ambulatory Visit: Payer: Self-pay | Admitting: Internal Medicine

## 2020-04-16 DIAGNOSIS — M8580 Other specified disorders of bone density and structure, unspecified site: Secondary | ICD-10-CM

## 2020-04-23 ENCOUNTER — Other Ambulatory Visit (HOSPITAL_COMMUNITY): Payer: Self-pay | Admitting: Internal Medicine

## 2020-04-23 DIAGNOSIS — R011 Cardiac murmur, unspecified: Secondary | ICD-10-CM

## 2020-05-08 ENCOUNTER — Other Ambulatory Visit: Payer: Self-pay

## 2020-05-08 ENCOUNTER — Ambulatory Visit (HOSPITAL_COMMUNITY): Payer: Medicare Other | Attending: Cardiology

## 2020-05-08 DIAGNOSIS — R011 Cardiac murmur, unspecified: Secondary | ICD-10-CM | POA: Diagnosis not present

## 2020-05-08 LAB — ECHOCARDIOGRAM COMPLETE
Area-P 1/2: 2.33 cm2
S' Lateral: 2.6 cm

## 2020-05-08 MED ORDER — PERFLUTREN LIPID MICROSPHERE
1.0000 mL | INTRAVENOUS | Status: AC | PRN
  Administered 2020-05-08: 1 mL via INTRAVENOUS
  Administered 2020-05-08: 2 mL via INTRAVENOUS

## 2020-05-25 ENCOUNTER — Ambulatory Visit
Admission: RE | Admit: 2020-05-25 | Discharge: 2020-05-25 | Disposition: A | Discharge status: Home / Self Care | Payer: No Typology Code available for payment source | Source: Ambulatory Visit | Attending: Internal Medicine | Admitting: Internal Medicine

## 2020-05-25 DIAGNOSIS — E785 Hyperlipidemia, unspecified: Secondary | ICD-10-CM

## 2020-06-10 NOTE — Progress Notes (Signed)
Cardiology Office Note:   Date:  54/0/9811  NAME:  Meghan Hoffman    MRN: [id number] DOB:  September 07, 1945   PCP:  Crist Infante, MD  Cardiologist:  No primary care provider on file.   Referring MD: Crist Infante, MD   Chief Complaint  Patient presents with  . Coronary Artery Disease   History of Present Illness:   Meghan Hoffman is a 74 y.o. female with a hx of CAD, HTN, HLD who is being seen today for the evaluation of CAD at the request of Crist Infante, MD.  Recently had an elevated coronary calcium score.  Was sent for further evaluation.  Medical history is significant for former tobacco abuse and hyperlipidemia.  Her most recent LDL cholesterol was 77.  Her PCP increased her Lipitor to 40 mg when he got the results of her calcium score.  Her EKG today demonstrates normal sinus rhythm without acute ischemic changes or evidence of prior infarction.  She reports he does not exercise routinely but has no symptoms such as chest pain or pressure.  She has no shortness of breath.  She reports can get around town without any major limitations.  She reports she can climb several flights of stairs without any exertional chest pain or pressure.  She did have ABIs done through her PCP that were normal.  She also had an abdominal aortic aneurysm screening ultrasound that was negative.  She had a recent echocardiogram that shows normal LV function.  She has a faint murmur on exam.  Overall she is without any symptoms today.  She reports her father died of a heart attack at 5.  She is a former smoker of 25 years.  She drinks alcohol moderation.  No illicit drug use.   Problem List 1. CAD -CAC score 488 (88th percentile) -minimal carotid artery plaque  2. HTN 3. HLD -Total cholesterol 164, HDL 71, LDL 77, triglycerides 80, A1c 5.1  Past Medical History: Past Medical History:  Diagnosis Date  . Anemia    "Pernicious Anemia"- injections every 3 weeks -next 11-25-14  . Arthritis   . Coronary artery  disease   . Depression   . H/O seasonal allergies   . Hyperlipidemia   . Hypertension   . Hypothyroidism   . PONV (postoperative nausea and vomiting)    once under general anesthesia    Past Surgical History: Past Surgical History:  Procedure Laterality Date  . ABDOMINAL HYSTERECTOMY    . COLONOSCOPY WITH PROPOFOL N/A 11/24/2014   Procedure: COLONOSCOPY WITH PROPOFOL;  Surgeon: Garlan Fair, MD;  Location: [facility];  Service: Endoscopy;  Laterality: N/A;  . KNEE SURGERY Left    open age 74  . LAPAROSCOPY     x2 ovarian cyst  . ORIF WRIST FRACTURE Right   . TONSILLECTOMY     age 74    Current Medications: Current Meds  Medication Sig  . atorvastatin (LIPITOR) 20 MG tablet Take 40 mg by mouth daily. 40 MG TOTAL DAILY  . B Complex Vitamins (VITAMIN B COMPLEX IJ) Inject as directed every 14 (fourteen) days.   . cholecalciferol (VITAMIN D) 1000 UNITS tablet Take 1,000 Units by mouth every evening.  . escitalopram (LEXAPRO) 10 MG tablet Take 10 mg by mouth daily.   . fexofenadine (ALLEGRA) 180 MG tablet Take 180 mg by mouth every evening.  . fluticasone (FLONASE) 50 MCG/ACT nasal spray Place 1 spray into both nostrils daily.  . hydrochlorothiazide (HYDRODIURIL) 25 MG tablet Take 12.5  mg by mouth every morning.  Marland Kitchen levothyroxine (SYNTHROID, LEVOTHROID) 125 MCG tablet Take 125 mcg by mouth daily before breakfast.   . magnesium gluconate (MAGONATE) 500 MG tablet Take 500 mg by mouth daily.  . potassium chloride (K-DUR,KLOR-CON) 10 MEQ tablet Take 10 mEq by mouth daily.  . TURMERIC PO Take 1 tablet by mouth 2 (two) times daily.     Allergies:    Patient has no known allergies.   Social History: Social History   Socioeconomic History  . Marital status: Married    Spouse name: Not on file  . Number of children: 2  . Years of education: Not on file  . Highest education level: Not on file  Occupational History  . Occupation: retired  Tobacco Use  . Smoking status:  Former Smoker    Packs/day: 2.00    Years: 25.00    Pack years: 50.00    Types: Cigarettes    Quit date: 11/13/1997    Years since quitting: 22.5  . Smokeless tobacco: Never Used  Substance and Sexual Activity  . Alcohol use: Yes    Comment: 3 glasses wine daily  . Drug use: No  . Sexual activity: Not on file  Other Topics Concern  . Not on file  Social History Narrative  . Not on file   Social Determinants of Health   Financial Resource Strain:   . Difficulty of Paying Living Expenses: Not on file  Food Insecurity:   . Worried About Charity fundraiser in the Last Year: Not on file  . Ran Out of Food in the Last Year: Not on file  Transportation Needs:   . Lack of Transportation (Medical): Not on file  . Lack of Transportation (Non-Medical): Not on file  Physical Activity:   . Days of Exercise per Week: Not on file  . Minutes of Exercise per Session: Not on file  Stress:   . Feeling of Stress : Not on file  Social Connections:   . Frequency of Communication with Friends and Family: Not on file  . Frequency of Social Gatherings with Friends and Family: Not on file  . Attends Religious Services: Not on file  . Active Member of Clubs or Organizations: Not on file  . Attends Archivist Meetings: Not on file  . Marital Status: Not on file     Family History: The patient's family history includes Breast cancer in her maternal aunt and paternal grandmother; Heart disease in her mother; Heart disease (age of onset: 73 in her father.  ROS:   All other ROS reviewed and negative. Pertinent positives noted in the HPI.     EKGs/Labs/Other Studies Reviewed:   The following studies were personally reviewed by me today:  EKG:  EKG is ordered today.  The ekg ordered today demonstrates normal sinus rhythm, heart rate 73, no acute ischemic changes, no evidence of prior infarction, and was personally reviewed by me.   CT CAC The observed calcium score of 488 is at the  percentile 88 for subjects of the same age, gender and race/ethnicity who are free of clinical cardiovascular disease and treated diabetes.  TTE 05/08/2020 1. Calcified aortic valve with reduced cusp excursion but no AS by  doppler.  2. Left ventricular ejection fraction, by estimation, is 60 to 65%. The  left ventricle has normal function. The left ventricle has no regional  wall motion abnormalities. 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 Left  ventricular  diastolic parameters are consistent with Grade I diastolic  dysfunction (impaired relaxation).  3. Right ventricular systolic function is normal. The right ventricular  size is normal.  4. The mitral valve is normal in structure. Trivial mitral valve  regurgitation. No evidence of mitral stenosis.  5. The aortic valve has an indeterminant number of cusps. Aortic valve  regurgitation is not visualized. No aortic stenosis is present.   Recent Labs: No results found for requested labs within last 8760 hours.   Recent Lipid Panel No results found for: CHOL, TRIG, HDL, CHOLHDL, VLDL, LDLCALC, LDLDIRECT  Physical Exam:   VS:  BP (!) 150/82   Pulse 73   Temp (!) 93.4 F (34.1 C)   Ht 5' 5.5" (1.664 m)   Wt 185 lb 9.6 oz (84.2 kg)   SpO2 98%   BMI 30.42 kg/m    Wt Readings from Last 3 Encounters:  06/11/20 185 lb 9.6 oz (84.2 kg)  12/03/19 171 lb 14.4 oz (78 kg)  11/24/14 168 lb (76.2 kg)    General: Well nourished, well developed, in no acute distress Heart: Atraumatic, normal size  Eyes: PEERLA, EOMI  Neck: Supple, no JVD Endocrine: No thryomegaly Cardiac: Normal S1, S2; RRR; faint 2 out of 6 systolic ejection murmur Lungs: Clear to auscultation bilaterally, no wheezing, rhonchi or rales  Abd: Soft, nontender, no hepatomegaly  Ext: No edema, pulses 2+ Musculoskeletal: No deformities, BUE and BLE strength normal and equal Skin: Warm and dry, no rashes   Neuro: Alert and oriented  to person, place, time, and situation, CNII-XII grossly intact, no focal deficits  Psych: Normal mood and affect   ASSESSMENT:   Amyrie Illingworth is a 74 y.o. female who presents for the following: 1. Coronary artery disease involving native coronary artery of native heart without angina pectoris   2. Mixed hyperlipidemia   3. Primary hypertension     PLAN:   1. Coronary artery disease involving native coronary artery of native heart without angina pectoris -CAC score 488 (88th percentile) -Normal echocardiogram. -EKG is normal.  Cardiovascular exam normal.  No symptoms of angina.  I recommended an exercise treadmill stress test just to make sure she has no underlying significant CAD.  She has exceptionally high HDL so I doubt she will.  Is a class IIb indication to perform an exercise treadmill with a coronary calcium score of 100.  2. Mixed hyperlipidemia -Goal LDL cholesterol less than 70.  Most recently increased Lipitor to 40 mg daily.  I will see her back in 3 months with a lipid profile fasting 1 week before.  3. Primary hypertension -BP a bit elevated today.  Apparently at home it runs around 120/70.  She will continue her current medications.   Disposition: Return in about 3 months (around 09/11/2020).  Medication Adjustments/Labs and Tests Ordered: Current medicines are reviewed at length with the patient today.  Concerns regarding medicines are outlined above.  Orders Placed This Encounter  Procedures  . Lipid panel  . EXERCISE TOLERANCE TEST (ETT)  . EKG 12-Lead   No orders of the defined types were placed in this encounter.   Patient Instructions  Medication Instructions:  No Changes In Medications at this time.  *If you need a refill on your cardiac medications before your next appointment, please call your pharmacy*  Lab Work: LIPID PANEL- PLEASE HAVE THIS DONE 1 WEEK PRIOR TO NEXT APPOINTMENT COVID TEST NEEDED PRIOR TO TEST  If you have labs (blood work)  drawn  today and your tests are completely normal, you will receive your results only by: Marland Kitchen MyChart Message (if you have MyChart) OR . A paper copy in the mail If you have any lab test that is abnormal or we need to change your treatment, we will call you to review the results.  Testing/Procedures: Your physician has requested that you have an exercise tolerance test. For further information please visit [email or web address]. Please also follow instruction sheet, as given.  Follow-Up: At [facility], you and your health needs are our priority.  As part of our continuing mission to provide you with exceptional heart care, we have created designated Provider Care Teams.  These Care Teams include your primary Cardiologist (physician) and Advanced Practice Providers (APPs -  Physician Assistants and Nurse Practitioners) who all work together to provide you with the care you need, when you need it.  We recommend signing up for the patient portal called "MyChart".  Sign up information is provided on this After Visit Summary.  MyChart is used to connect with patients for Virtual Visits (Telemedicine).  Patients are able to view lab/test results, encounter notes, upcoming appointments, etc.  Non-urgent messages can be sent to your provider as well.   To learn more about what you can do with MyChart, go to [email or web address].    Your next appointment:   3 month(s)  The format for your next appointment:   In Person  Provider:   Eleonore Chiquito, MD     Signed, Addison Naegeli. Audie Box, [place]  [address], [place], [place] [address] [phone]  06/11/2020 2:48 PM

## 2020-06-11 ENCOUNTER — Ambulatory Visit: Payer: Medicare Other | Admitting: Cardiovascular Disease

## 2020-06-11 ENCOUNTER — Other Ambulatory Visit: Payer: Self-pay

## 2020-06-11 ENCOUNTER — Encounter: Payer: Self-pay | Admitting: Cardiovascular Disease

## 2020-06-11 VITALS — BP 150/82 | HR 73 | Temp 93.4°F | Ht 65.5 in | Wt 185.6 lb

## 2020-06-11 DIAGNOSIS — I10 Essential (primary) hypertension: Secondary | ICD-10-CM

## 2020-06-11 DIAGNOSIS — E782 Mixed hyperlipidemia: Secondary | ICD-10-CM

## 2020-06-11 DIAGNOSIS — I2510 Atherosclerotic heart disease of native coronary artery without angina pectoris: Secondary | ICD-10-CM

## 2020-06-11 NOTE — Patient Instructions (Addendum)
Medication Instructions:  No Changes In Medications at this time.  *If you need a refill on your cardiac medications before your next appointment, please call your pharmacy*  Lab Work: LIPID PANEL- PLEASE HAVE THIS DONE 1 Coram TEST NEEDED PRIOR TO TEST  If you have labs (blood work) drawn today and your tests are completely normal, you will receive your results only by: Marland Kitchen MyChart Message (if you have MyChart) OR . A paper copy in the mail If you have any lab test that is abnormal or we need to change your treatment, we will call you to review the results.  Testing/Procedures: Your physician has requested that you have an exercise tolerance test. For further information please visit [email or web address]. Please also follow instruction sheet, as given.  Follow-Up: At [facility], you and your health needs are our priority.  As part of our continuing mission to provide you with exceptional heart care, we have created designated Provider Care Teams.  These Care Teams include your primary Cardiologist (physician) and Advanced Practice Providers (APPs -  Physician Assistants and Nurse Practitioners) who all work together to provide you with the care you need, when you need it.  We recommend signing up for the patient portal called "MyChart".  Sign up information is provided on this After Visit Summary.  MyChart is used to connect with patients for Virtual Visits (Telemedicine).  Patients are able to view lab/test results, encounter notes, upcoming appointments, etc.  Non-urgent messages can be sent to your provider as well.   To learn more about what you can do with MyChart, go to [email or web address].    Your next appointment:   3 month(s)  The format for your next appointment:   In Person  Provider:   Eleonore Chiquito, MD

## 2020-06-15 ENCOUNTER — Other Ambulatory Visit: Payer: Self-pay | Admitting: Internal Medicine

## 2020-06-15 ENCOUNTER — Telehealth: Payer: Self-pay | Admitting: Cardiovascular Disease

## 2020-06-15 ENCOUNTER — Encounter: Payer: Self-pay | Admitting: Cardiovascular Disease

## 2020-06-15 DIAGNOSIS — Z1231 Encounter for screening mammogram for malignant neoplasm of breast: Secondary | ICD-10-CM

## 2020-06-15 NOTE — Telephone Encounter (Signed)
    Pt would like to r/s her ETT and covid test. She said she will be out of town on 10/22

## 2020-06-15 NOTE — Telephone Encounter (Signed)
ETT rescheduled for 07/02/20.

## 2020-06-23 ENCOUNTER — Other Ambulatory Visit (HOSPITAL_COMMUNITY): Payer: Medicare Other

## 2020-06-26 ENCOUNTER — Inpatient Hospital Stay (HOSPITAL_COMMUNITY): Admission: RE | Admit: 2020-06-26 | Payer: Medicare Other | Source: Ambulatory Visit

## 2020-06-29 ENCOUNTER — Other Ambulatory Visit (HOSPITAL_COMMUNITY)
Admission: RE | Admit: 2020-06-29 | Discharge: 2020-06-29 | Disposition: A | Discharge status: Home / Self Care | Payer: Medicare Other | Source: Ambulatory Visit | Attending: Cardiovascular Disease | Admitting: Cardiovascular Disease

## 2020-06-29 DIAGNOSIS — Z20822 Contact with and (suspected) exposure to covid-19: Secondary | ICD-10-CM | POA: Insufficient documentation

## 2020-06-29 DIAGNOSIS — Z01812 Encounter for preprocedural laboratory examination: Secondary | ICD-10-CM | POA: Insufficient documentation

## 2020-06-29 LAB — SARS CORONAVIRUS 2 (TAT 6-24 HRS): SARS Coronavirus 2: NEGATIVE

## 2020-07-02 ENCOUNTER — Ambulatory Visit (HOSPITAL_COMMUNITY)
Admission: RE | Admit: 2020-07-02 | Discharge: 2020-07-02 | Disposition: A | Discharge status: Home / Self Care | Payer: Medicare Other | Source: Ambulatory Visit | Attending: Cardiovascular Disease | Admitting: Cardiovascular Disease

## 2020-07-02 ENCOUNTER — Other Ambulatory Visit: Payer: Self-pay

## 2020-07-02 ENCOUNTER — Telehealth (HOSPITAL_COMMUNITY): Payer: Self-pay | Admitting: *Deleted

## 2020-07-02 DIAGNOSIS — E782 Mixed hyperlipidemia: Secondary | ICD-10-CM | POA: Diagnosis not present

## 2020-07-02 DIAGNOSIS — I2510 Atherosclerotic heart disease of native coronary artery without angina pectoris: Secondary | ICD-10-CM

## 2020-07-02 DIAGNOSIS — I10 Essential (primary) hypertension: Secondary | ICD-10-CM

## 2020-07-02 LAB — EXERCISE TOLERANCE TEST
Estimated workload: 8.5 METS
Exercise duration (min): 7 min
Exercise duration (sec): 0 s
MPHR: 147 {beats}/min
Peak HR: 131 {beats}/min
Percent HR: 89 %
Rest HR: 75 {beats}/min

## 2020-07-02 NOTE — Telephone Encounter (Signed)
Close encounter 

## 2020-07-24 ENCOUNTER — Ambulatory Visit
Admission: RE | Admit: 2020-07-24 | Discharge: 2020-07-24 | Disposition: A | Discharge status: Home / Self Care | Payer: Medicare Other | Source: Ambulatory Visit | Attending: Internal Medicine | Admitting: Internal Medicine

## 2020-07-24 ENCOUNTER — Other Ambulatory Visit: Payer: Self-pay

## 2020-07-24 DIAGNOSIS — M8580 Other specified disorders of bone density and structure, unspecified site: Secondary | ICD-10-CM

## 2020-07-27 ENCOUNTER — Ambulatory Visit
Admission: RE | Admit: 2020-07-27 | Discharge: 2020-07-27 | Disposition: A | Discharge status: Home / Self Care | Payer: Medicare Other | Source: Ambulatory Visit | Attending: Internal Medicine | Admitting: Internal Medicine

## 2020-07-27 ENCOUNTER — Other Ambulatory Visit: Payer: Self-pay

## 2020-07-27 DIAGNOSIS — Z1231 Encounter for screening mammogram for malignant neoplasm of breast: Secondary | ICD-10-CM

## 2020-07-29 ENCOUNTER — Other Ambulatory Visit (HOSPITAL_COMMUNITY): Payer: Self-pay | Admitting: Internal Medicine

## 2020-07-29 ENCOUNTER — Ambulatory Visit: Payer: Medicare Other | Attending: Internal Medicine

## 2020-07-29 DIAGNOSIS — Z23 Encounter for immunization: Secondary | ICD-10-CM

## 2020-07-29 NOTE — Progress Notes (Signed)
   Covid-19 Vaccination Clinic  Name:  Amirra Herling    MRN: [id number] DOB: 10/24/1945  07/29/2020  Ms. Games was observed post Covid-19 immunization for 15 minutes without incident. She was provided with Vaccine Information Sheet and instruction to access the V-Safe system.   Ms. Ghazarian was instructed to call 911 with any severe reactions post vaccine: Marland Kitchen Difficulty breathing  . Swelling of face and throat  . A fast heartbeat  . A bad rash all over body  . Dizziness and weakness   Immunizations Administered    Name Date Dose VIS Date Route   Pfizer COVID-19 Vaccine 07/29/2020 10:18 AM 0.3 mL 06/24/2020 Intramuscular   Manufacturer: [employer]   Lot: [id number]   NDC: [address]-8

## 2020-09-07 LAB — LIPID PANEL
Chol/HDL Ratio: 2.4 ratio (ref 0.0–4.4)
Cholesterol, Total: 169 mg/dL (ref 100–199)
HDL: 71 mg/dL (ref >39)
LDL Chol Calc (NIH): 79 mg/dL (ref 0–99)
Triglycerides: 104 mg/dL (ref 0–149)
VLDL Cholesterol Cal: 19 mg/dL (ref 5–40)

## 2020-09-13 NOTE — Progress Notes (Signed)
Cardiology Office Note:   Date:  09/14/2020  NAME:  Meghan Hoffman    MRN: [id number] DOB:  Nov 11, 1945   PCP:  Rodrigo Ran, MD  Cardiologist:  No primary care provider on file.   Referring MD: Rodrigo Ran, MD   Chief Complaint  Patient presents with   Coronary Artery Disease   History of Present Illness:   Meghan Hoffman is a 76 y.o. female with a hx of CAD (calcium score 488, negative ETT), HTN, HLD who presents for follow-up. Lipitor increased to 40 mg. LDL 79.  She reports she is doing well.  No chest pain or shortness of breath.  She is exercising up to 1/2 mile per day without any limitations such as chest pain, shortness of breath or palpitations.  Blood pressure is a bit elevated today in the office but apparently at home it is within normal limits.  She did complete a stress test last year that was normal.  Despite her high calcium score she does have high HDL cholesterol which is protective.  She overall is doing quite well.  We did discuss keeping her cholesterol medication at the current dose.  Given her high HDL cholesterol I think this is okay.  Problem List 1. CAD -CAC score 488 (88th percentile) -ETT 07/02/2020: 7:00 min;s; 8.5 METS; negative for ischemia  -minimal carotid artery plaque  2. HTN 3. HLD -Total cholesterol 169, HDL 71, LDL 79, TG 104  Past Medical History: Past Medical History:  Diagnosis Date   Anemia    "Pernicious Anemia"- injections every 3 weeks -next 11-25-14   Arthritis    Coronary artery disease    Depression    H/O seasonal allergies    Hyperlipidemia    Hypertension    Hypothyroidism    PONV (postoperative nausea and vomiting)    once under general anesthesia    Past Surgical History: Past Surgical History:  Procedure Laterality Date   ABDOMINAL HYSTERECTOMY     COLONOSCOPY WITH PROPOFOL N/A 11/24/2014   Procedure: COLONOSCOPY WITH PROPOFOL;  Surgeon: Charolett Bumpers, MD;  Location: [facility];  Service:  Endoscopy;  Laterality: N/A;   KNEE SURGERY Left    open age 42   LAPAROSCOPY     x2 ovarian cyst   ORIF WRIST FRACTURE Right    TONSILLECTOMY     age 32    Current Medications: Current Meds  Medication Sig   atorvastatin (LIPITOR) 20 MG tablet Take 40 mg by mouth daily. 40 MG TOTAL DAILY   B Complex Vitamins (VITAMIN B COMPLEX IJ) Inject as directed every 14 (fourteen) days.    cholecalciferol (VITAMIN D) 1000 UNITS tablet Take 1,000 Units by mouth every evening.   escitalopram (LEXAPRO) 10 MG tablet Take 10 mg by mouth daily.    fexofenadine (ALLEGRA) 180 MG tablet Take 180 mg by mouth every evening.   fluticasone (FLONASE) 50 MCG/ACT nasal spray Place 1 spray into both nostrils daily.   hydrochlorothiazide (HYDRODIURIL) 25 MG tablet Take 12.5 mg by mouth every morning.   levothyroxine (SYNTHROID, LEVOTHROID) 125 MCG tablet Take 125 mcg by mouth daily before breakfast.    magnesium gluconate (MAGONATE) 500 MG tablet Take 500 mg by mouth daily.   potassium chloride (K-DUR,KLOR-CON) 10 MEQ tablet Take 10 mEq by mouth daily.   TURMERIC PO Take 1 tablet by mouth 2 (two) times daily.     Allergies:    Patient has no known allergies.   Social History: Social History  Socioeconomic History   Marital status: Married    Spouse name: Not on file   Number of children: 2   Years of education: Not on file   Highest education level: Not on file  Occupational History   Occupation: retired  Tobacco Use   Smoking status: Former Smoker    Packs/day: 2.00    Years: 25.00    Pack years: 50.00    Types: Cigarettes    Quit date: 11/13/1997    Years since quitting: 22.8   Smokeless tobacco: Never Used  Substance and Sexual Activity   Alcohol use: Yes    Comment: 3 glasses wine daily   Drug use: No   Sexual activity: Not on file  Other Topics Concern   Not on file  Social History Narrative   Not on file   Social Determinants of Health   Financial  Resource Strain: Not on file  Food Insecurity: Not on file  Transportation Needs: Not on file  Physical Activity: Not on file  Stress: Not on file  Social Connections: Not on file     Family History: The patient's family history includes Breast cancer in her maternal aunt and paternal grandmother; Heart disease in her mother; Heart disease (age of onset: 78) in her father.  ROS:   All other ROS reviewed and negative. Pertinent positives noted in the HPI.     EKGs/Labs/Other Studies Reviewed:   The following studies were personally reviewed by me today:   ETT 07/02/2020  There was no ST segment deviation noted during stress.  Blood pressure demonstrated a normal response to exercise.  The patient exercised 7 minutes on a CVN-Bruce protocol achieving 8.5 METs. Achieved a max HR of 131bpm which represents 89% of age-predicted maximum.  No electocardiographic evidence of ischemia.  TTE 05/08/2020  1. Calcified aortic valve with reduced cusp excursion but no AS by  doppler.  2. Left ventricular ejection fraction, by estimation, is 60 to 65%. The  left ventricle has normal function. The left ventricle has no regional  wall motion abnormalities. 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 Left  ventricular diastolic parameters are consistent with Grade I diastolic  dysfunction (impaired relaxation).  3. Right ventricular systolic function is normal. The right ventricular  size is normal.  4. The mitral valve is normal in structure. Trivial mitral valve  regurgitation. No evidence of mitral stenosis.  5. The aortic valve has an indeterminant number of cusps. Aortic valve  regurgitation is not visualized. No aortic stenosis is present.   Recent Labs: No results found for requested labs within last 8760 hours.   Recent Lipid Panel    Component Value Date/Time   CHOL 169 09/07/2020 0925   TRIG 104 09/07/2020 0925   HDL 71 09/07/2020 0925    CHOLHDL 2.4 09/07/2020 0925   LDLCALC 79 09/07/2020 0925    Physical Exam:   VS:  BP (!) 142/91    Pulse 75    Temp (!) 97 F (36.1 C)    Ht 5' 5.5" (1.664 m)    Wt 188 lb 6.4 oz (85.5 kg)    SpO2 95%    BMI 30.87 kg/m    Wt Readings from Last 3 Encounters:  09/14/20 188 lb 6.4 oz (85.5 kg)  06/11/20 185 lb 9.6 oz (84.2 kg)  12/03/19 171 lb 14.4 oz (78 kg)    General: Well nourished, well developed, in no acute distress Head: Atraumatic, normal size  Eyes: PEERLA,  EOMI  Neck: Supple, no JVD Endocrine: No thryomegaly Cardiac: Normal S1, S2; RRR; no murmurs, rubs, or gallops Lungs: Clear to auscultation bilaterally, no wheezing, rhonchi or rales  Abd: Soft, nontender, no hepatomegaly  Ext: No edema, pulses 2+ Musculoskeletal: No deformities, BUE and BLE strength normal and equal Skin: Warm and dry, no rashes   Neuro: Alert and oriented to person, place, time, and situation, CNII-XII grossly intact, no focal deficits  Psych: Normal mood and affect   ASSESSMENT:   Meghan Hoffman is a 75 y.o. female who presents for the following: 1. Coronary artery disease involving native coronary artery of native heart without angina pectoris   2. Agatston coronary artery calcium score greater than 400   3. Mixed hyperlipidemia     PLAN:   1. Coronary artery disease involving native coronary artery of native heart without angina pectoris 2. Agatston coronary artery calcium score greater than 400 3. Mixed hyperlipidemia -CAC score 488 (88th percentile) -ETT 07/02/2020: 7:00 min;s; 8.5 METS; negative for ischemia  -minimal carotid artery plaque  -Most recent LDL cholesterol 79.  On Lipitor 40 mg a day.  She does have an HDL cholesterol of 71 which is protective.  Given that her HDL cholesterol so high I think her LDL cholesterol is okay. -She should continue on aspirin 81 mg a day -No symptoms concerning for angina.  BP a bit elevated but normal at home.  We will continue to monitor this.   She will see me yearly for review of lab work.  Disposition: Return in about 1 year (around 09/14/2021).  Medication Adjustments/Labs and Tests Ordered: Current medicines are reviewed at length with the patient today.  Concerns regarding medicines are outlined above.  No orders of the defined types were placed in this encounter.  No orders of the defined types were placed in this encounter.   Patient Instructions  Medication Instructions:  Your physician recommends that you continue on your current medications as directed. Please refer to the Current Medication list given to you today.  *If you need a refill on your cardiac medications before your next appointment, please call your pharmacy*  Follow-Up: At [facility], you and your health needs are our priority.  As part of our continuing mission to provide you with exceptional heart care, we have created designated Provider Care Teams.  These Care Teams include your primary Cardiologist (physician) and Advanced Practice Providers (APPs -  Physician Assistants and Nurse Practitioners) who all work together to provide you with the care you need, when you need it.  We recommend signing up for the patient portal called "MyChart".  Sign up information is provided on this After Visit Summary.  MyChart is used to connect with patients for Virtual Visits (Telemedicine).  Patients are able to view lab/test results, encounter notes, upcoming appointments, etc.  Non-urgent messages can be sent to your provider as well.   To learn more about what you can do with MyChart, go to [email or web address].    Your next appointment:   12 month(s)  The format for your next appointment:   In Person  Provider:   You may see Dr. Audie Box or one of the following Advanced Practice Providers on your designated Care Team:    Almyra Deforest, PA-C  Fabian Sharp, [place] or   Roby Lofts, [place]    Other Instructions      Time Spent with Patient: I have  spent a total of 25 minutes with patient reviewing hospital notes, telemetry,  EKGs, labs and examining the patient as well as establishing an assessment and plan that was discussed with the patient.  > 50% of time was spent in direct patient care.  Signed, Addison Naegeli. Audie Box, [place]  [address] [place], [place] [address] [phone]  09/14/2020 4:34 PM

## 2020-09-14 ENCOUNTER — Ambulatory Visit: Payer: Medicare Other | Admitting: Cardiovascular Disease

## 2020-09-14 ENCOUNTER — Other Ambulatory Visit: Payer: Self-pay

## 2020-09-14 ENCOUNTER — Encounter: Payer: Self-pay | Admitting: Cardiovascular Disease

## 2020-09-14 VITALS — BP 142/91 | HR 75 | Temp 97.0°F | Ht 65.5 in | Wt 188.4 lb

## 2020-09-14 DIAGNOSIS — R931 Abnormal findings on diagnostic imaging of heart and coronary circulation: Secondary | ICD-10-CM

## 2020-09-14 DIAGNOSIS — I2510 Atherosclerotic heart disease of native coronary artery without angina pectoris: Secondary | ICD-10-CM

## 2020-09-14 DIAGNOSIS — E782 Mixed hyperlipidemia: Secondary | ICD-10-CM

## 2020-09-14 NOTE — Patient Instructions (Signed)
Medication Instructions:  Your physician recommends that you continue on your current medications as directed. Please refer to the Current Medication list given to you today.  *If you need a refill on your cardiac medications before your next appointment, please call your pharmacy*   Follow-Up: At [facility], you and your health needs are our priority.  As part of our continuing mission to provide you with exceptional heart care, we have created designated Provider Care Teams.  These Care Teams include your primary Cardiologist (physician) and Advanced Practice Providers (APPs -  Physician Assistants and Nurse Practitioners) who all work together to provide you with the care you need, when you need it.  We recommend signing up for the patient portal called "MyChart".  Sign up information is provided on this After Visit Summary.  MyChart is used to connect with patients for Virtual Visits (Telemedicine).  Patients are able to view lab/test results, encounter notes, upcoming appointments, etc.  Non-urgent messages can be sent to your provider as well.   To learn more about what you can do with MyChart, go to [email or web address].    Your next appointment:   12 month(s)  The format for your next appointment:   In Person  Provider:   You may see Dr. [name] or one of the following Advanced Practice Providers on your designated Care Team:    Hao Meng, PA-C  Angela Duke, PA-C or   Krista Kroeger, PA-C    Other Instructions  

## 2021-02-17 ENCOUNTER — Other Ambulatory Visit: Payer: Self-pay

## 2021-02-17 ENCOUNTER — Ambulatory Visit: Payer: Medicare Other | Attending: Internal Medicine

## 2021-02-17 DIAGNOSIS — Z23 Encounter for immunization: Secondary | ICD-10-CM

## 2021-02-17 NOTE — Progress Notes (Signed)
   Covid-19 Vaccination Clinic  Name:  Meghan Hoffman    MRN: [id number] DOB: 08-Sep-1945  02/17/2021  Ms. Meghan Hoffman was observed post Covid-19 immunization for 15 minutes without incident. She was provided with Vaccine Information Sheet and instruction to access the V-Safe system.   Ms. Meghan Hoffman was instructed to call 911 with any severe reactions post vaccine: Difficulty breathing  Swelling of face and throat  A fast heartbeat  A bad rash all over body  Dizziness and weakness   Immunizations Administered     Name Date Dose VIS Date Route   PFIZER Comrnaty(Gray TOP) Covid-19 Vaccine 02/17/2021  2:33 PM 0.3 mL 08/13/2020 Intramuscular   Manufacturer: [place]   Lot: [id number]   [place]: [phone]

## 2021-06-28 ENCOUNTER — Other Ambulatory Visit: Payer: Self-pay | Admitting: Internal Medicine

## 2021-06-28 DIAGNOSIS — Z1231 Encounter for screening mammogram for malignant neoplasm of breast: Secondary | ICD-10-CM

## 2021-08-03 ENCOUNTER — Ambulatory Visit: Payer: Medicare Other

## 2021-08-16 ENCOUNTER — Ambulatory Visit
Admission: RE | Admit: 2021-08-16 | Discharge: 2021-08-16 | Disposition: A | Discharge status: Home / Self Care | Payer: Medicare Other | Source: Ambulatory Visit | Attending: Internal Medicine | Admitting: Internal Medicine

## 2021-08-16 DIAGNOSIS — Z1231 Encounter for screening mammogram for malignant neoplasm of breast: Secondary | ICD-10-CM

## 2021-08-17 ENCOUNTER — Other Ambulatory Visit: Payer: Self-pay | Admitting: Internal Medicine

## 2021-08-17 DIAGNOSIS — R928 Other abnormal and inconclusive findings on diagnostic imaging of breast: Secondary | ICD-10-CM

## 2021-09-16 ENCOUNTER — Ambulatory Visit
Admission: RE | Admit: 2021-09-16 | Discharge: 2021-09-16 | Disposition: A | Discharge status: Home / Self Care | Payer: Medicare Other | Source: Ambulatory Visit | Attending: Internal Medicine | Admitting: Internal Medicine

## 2021-09-16 ENCOUNTER — Other Ambulatory Visit: Payer: Self-pay | Admitting: Internal Medicine

## 2021-09-16 ENCOUNTER — Other Ambulatory Visit: Payer: Self-pay

## 2021-09-16 DIAGNOSIS — R928 Other abnormal and inconclusive findings on diagnostic imaging of breast: Secondary | ICD-10-CM

## 2021-09-16 DIAGNOSIS — N6320 Unspecified lump in the left breast, unspecified quadrant: Secondary | ICD-10-CM

## 2021-09-28 ENCOUNTER — Ambulatory Visit
Admission: RE | Admit: 2021-09-28 | Discharge: 2021-09-28 | Disposition: A | Discharge status: Home / Self Care | Payer: Medicare Other | Source: Ambulatory Visit | Attending: Internal Medicine | Admitting: Internal Medicine

## 2021-09-28 DIAGNOSIS — R928 Other abnormal and inconclusive findings on diagnostic imaging of breast: Secondary | ICD-10-CM

## 2021-09-28 DIAGNOSIS — N6320 Unspecified lump in the left breast, unspecified quadrant: Secondary | ICD-10-CM

## 2021-09-30 ENCOUNTER — Other Ambulatory Visit: Payer: Self-pay

## 2021-09-30 ENCOUNTER — Encounter: Payer: Self-pay | Admitting: Cardiovascular Disease

## 2021-09-30 ENCOUNTER — Telehealth: Payer: Self-pay | Admitting: Hematology and Oncology

## 2021-09-30 ENCOUNTER — Ambulatory Visit: Payer: Medicare Other | Admitting: Cardiovascular Disease

## 2021-09-30 VITALS — BP 124/76 | HR 78 | Ht 65.5 in | Wt 172.4 lb

## 2021-09-30 DIAGNOSIS — R931 Abnormal findings on diagnostic imaging of heart and coronary circulation: Secondary | ICD-10-CM

## 2021-09-30 DIAGNOSIS — E782 Mixed hyperlipidemia: Secondary | ICD-10-CM | POA: Diagnosis not present

## 2021-09-30 DIAGNOSIS — I2510 Atherosclerotic heart disease of native coronary artery without angina pectoris: Secondary | ICD-10-CM | POA: Diagnosis not present

## 2021-09-30 DIAGNOSIS — I358 Other nonrheumatic aortic valve disorders: Secondary | ICD-10-CM | POA: Diagnosis not present

## 2021-09-30 NOTE — Progress Notes (Signed)
Cardiology Office Note:   Date:  0/17/7939  NAME:  Meghan Hoffman    MRN: [id number] DOB:  06-25-1946   PCP:  Crist Infante, MD  Cardiologist:  None  Electrophysiologist:  None   Referring MD: Crist Infante, MD   Chief Complaint  Patient presents with   Follow-up    NO SOB, NO CHEST PAIN OR TIGHTNESS   History of Present Illness:   Meghan Hoffman is a 76 y.o. female with a hx of CAD, HLD, HTN who presents for follow-up.  She reports she is doing well.  Denies any chest pain or trouble breathing.  No structured exercise.  She has no limitations with activity around the house.  Her most recent LDL cholesterol was 55.  She is tolerating Lipitor 40 mg daily without any issues.  Still has a murmur of mild aortic stenosis.  No significant disease on echocardiogram.  Likely can be repeated in the next few years.  Blood pressure is well controlled.  She reports she was recently diagnosed with breast cancer.  Awaiting to see the oncologist.  Problem List 1. CAD -CAC score 488 (88th percentile) -ETT 07/02/2020: 7:00 min;s; 8.5 METS; negative for ischemia  -minimal carotid artery plaque  2. HTN 3. HLD -Total cholesterol 135, HDL 54, LDL 55, triglycerides 128 -A1c 5.2  Past Medical History: Past Medical History:  Diagnosis Date   Anemia    "Pernicious Anemia"- injections every 3 weeks -next 11-25-14   Arthritis    Coronary artery disease    Depression    H/O seasonal allergies    Hyperlipidemia    Hypertension    Hypothyroidism    PONV (postoperative nausea and vomiting)    once under general anesthesia    Past Surgical History: Past Surgical History:  Procedure Laterality Date   ABDOMINAL HYSTERECTOMY     COLONOSCOPY WITH PROPOFOL N/A 11/24/2014   Procedure: COLONOSCOPY WITH PROPOFOL;  Surgeon: Garlan Fair, MD;  Location: [facility];  Service: Endoscopy;  Laterality: N/A;   KNEE SURGERY Left    open age 57   LAPAROSCOPY     x2 ovarian cyst   ORIF WRIST FRACTURE  Right    TONSILLECTOMY     age 10    Current Medications: Current Meds  Medication Sig   atorvastatin (LIPITOR) 20 MG tablet Take 40 mg by mouth daily. 40 MG TOTAL DAILY   B Complex Vitamins (VITAMIN B COMPLEX IJ) Inject as directed every 14 (fourteen) days.    cholecalciferol (VITAMIN D) 1000 UNITS tablet Take 1,000 Units by mouth every evening.   escitalopram (LEXAPRO) 10 MG tablet Take 10 mg by mouth daily.    fexofenadine (ALLEGRA) 180 MG tablet Take 180 mg by mouth every evening.   fluticasone (FLONASE) 50 MCG/ACT nasal spray Place 1 spray into both nostrils daily.   hydrochlorothiazide (HYDRODIURIL) 25 MG tablet Take 12.5 mg by mouth every morning.   levothyroxine (SYNTHROID, LEVOTHROID) 125 MCG tablet Take 125 mcg by mouth daily before breakfast.    magnesium gluconate (MAGONATE) 500 MG tablet Take 500 mg by mouth daily.   OZEMPIC, 1 MG/DOSE, 4 MG/3ML SOPN Inject into the skin.   potassium chloride (K-DUR,KLOR-CON) 10 MEQ tablet Take 10 mEq by mouth daily.   TURMERIC PO Take 1 tablet by mouth 2 (two) times daily.     Allergies:    Patient has no known allergies.   Social History: Social History   Socioeconomic History   Marital status: Married  Spouse name: Not on file   Number of children: 2   Years of education: Not on file   Highest education level: Not on file  Occupational History   Occupation: retired  Tobacco Use   Smoking status: Former    Packs/day: 2.00    Years: 25.00    Pack years: 50.00    Types: Cigarettes    Quit date: 11/13/1997    Years since quitting: 23.8   Smokeless tobacco: Never  Substance and Sexual Activity   Alcohol use: Yes    Comment: 3 glasses wine daily   Drug use: No   Sexual activity: Not on file  Other Topics Concern   Not on file  Social History Narrative   Not on file   Social Determinants of Health   Financial Resource Strain: Not on file  Food Insecurity: Not on file  Transportation Needs: Not on file  Physical  Activity: Not on file  Stress: Not on file  Social Connections: Not on file     Family History: The patient's family history includes Breast cancer in her maternal aunt and paternal grandmother; Heart disease in her mother; Heart disease (age of onset: 85) in her father.  ROS:   All other ROS reviewed and negative. Pertinent positives noted in the HPI.     EKGs/Labs/Other Studies Reviewed:   The following studies were personally reviewed by me today:   TTE 05/08/2020  1. Calcified aortic valve with reduced cusp excursion but no AS by  doppler.   2. Left ventricular ejection fraction, by estimation, is 60 to 65%. The  left ventricle has normal function. The left ventricle has no regional  wall motion abnormalities. 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 Left  ventricular diastolic parameters are consistent with Grade I diastolic  dysfunction (impaired relaxation).   3. Right ventricular systolic function is normal. The right ventricular  size is normal.   4. The mitral valve is normal in structure. Trivial mitral valve  regurgitation. No evidence of mitral stenosis.   5. The aortic valve has an indeterminant number of cusps. Aortic valve  regurgitation is not visualized. No aortic stenosis is present.   Recent Labs: No results found for requested labs within last 8760 hours.   Recent Lipid Panel    Component Value Date/Time   CHOL 169 09/07/2020 0925   TRIG 104 09/07/2020 0925   HDL 71 09/07/2020 0925   CHOLHDL 2.4 09/07/2020 0925   LDLCALC 79 09/07/2020 0925    Physical Exam:   VS:  BP 124/76    Pulse 78    Ht 5' 5.5" (1.664 m)    Wt 172 lb 6.4 oz (78.2 kg)    SpO2 99%    BMI 28.25 kg/m    Wt Readings from Last 3 Encounters:  09/30/21 172 lb 6.4 oz (78.2 kg)  09/14/20 188 lb 6.4 oz (85.5 kg)  06/11/20 185 lb 9.6 oz (84.2 kg)    General: Well nourished, well developed, in no acute distress Head: Atraumatic, normal size  Eyes:  PEERLA, EOMI  Neck: Supple, no JVD Endocrine: No thryomegaly Cardiac: Normal S1, S2; RRR; 2 out of 6 systolic ejection murmur Lungs: Clear to auscultation bilaterally, no wheezing, rhonchi or rales  Abd: Soft, nontender, no hepatomegaly  Ext: No edema, pulses 2+ Musculoskeletal: No deformities, BUE and BLE strength normal and equal Skin: Warm and dry, no rashes   Neuro: Alert and oriented to person, place, time, and situation,  CNII-XII grossly intact, no focal deficits  Psych: Normal mood and affect   ASSESSMENT:   Meghan Hoffman is a 76 y.o. female who presents for the following: 1. Coronary artery disease involving native coronary artery of native heart without angina pectoris   2. Agatston coronary artery calcium score greater than 400   3. Mixed hyperlipidemia   4. Aortic valve sclerosis    PLAN:   1. Coronary artery disease involving native coronary artery of native heart without angina pectoris 2. Agatston coronary artery calcium score greater than 400 3. Mixed hyperlipidemia -Elevated coronary calcium score 488 and 88th percentile.  Exercise treadmill stress test last year was normal.  Minimal carotid artery plaque.  She is continued on aspirin 81 mg daily every other day.  She is also continued on 40 mg Lipitor daily.  Most recent LDL cholesterol was 55 which is at goal.  We discussed regular exercise as well as proper diet.  She will work on both of these.  She continues to do well and denies any symptoms of angina.  We will continue with aggressive secondary prevention.  4. Aortic valve sclerosis -2 out of 6 systolic ejection murmur consistent with aortic stenosis.  No significant stenosis by echo.  Suspect we will just repeat an echo in 2 to 3 years just to make sure there is been no change.  She denies any symptoms.  Disposition: Return in about 1 year (around 09/30/2022).  Medication Adjustments/Labs and Tests Ordered: Current medicines are reviewed at length with the  patient today.  Concerns regarding medicines are outlined above.  No orders of the defined types were placed in this encounter.  No orders of the defined types were placed in this encounter.   Patient Instructions  Medication Instructions:  The current medical regimen is effective;  continue present plan and medications.  *If you need a refill on your cardiac medications before your next appointment, please call your pharmacy*   Follow-Up: At [facility], you and your health needs are our priority.  As part of our continuing mission to provide you with exceptional heart care, we have created designated Provider Care Teams.  These Care Teams include your primary Cardiologist (physician) and Advanced Practice Providers (APPs -  Physician Assistants and Nurse Practitioners) who all work together to provide you with the care you need, when you need it.  We recommend signing up for the patient portal called "MyChart".  Sign up information is provided on this After Visit Summary.  MyChart is used to connect with patients for Virtual Visits (Telemedicine).  Patients are able to view lab/test results, encounter notes, upcoming appointments, etc.  Non-urgent messages can be sent to your provider as well.   To learn more about what you can do with MyChart, go to [email or web address].    Your next appointment:   12 month(s)  The format for your next appointment:   In Person  Provider:   Eleonore Chiquito, MD or Sande Rives, PA-C, or Almyra Deforest, PA-C {      Time Spent with Patient: I have spent a total of 25 minutes with patient reviewing hospital notes, telemetry, EKGs, labs and examining the patient as well as establishing an assessment and plan that was discussed with the patient.  > 50% of time was spent in direct patient care.  Signed, Addison Naegeli. Audie Box, MD, [place]  [address], [place], [place] [address] [phone]  09/30/2021 10:13 AM

## 2021-09-30 NOTE — Telephone Encounter (Signed)
Spoke to patient to confirm afternoon clinic appointment for 2/1, will send packet via e-mail

## 2021-09-30 NOTE — Patient Instructions (Signed)
Medication Instructions:  The current medical regimen is effective;  continue present plan and medications.  *If you need a refill on your cardiac medications before your next appointment, please call your pharmacy*   Follow-Up: At [facility], you and your health needs are our priority.  As part of our continuing mission to provide you with exceptional heart care, we have created designated Provider Care Teams.  These Care Teams include your primary Cardiologist (physician) and Advanced Practice Providers (APPs -  Physician Assistants and Nurse Practitioners) who all work together to provide you with the care you need, when you need it.  We recommend signing up for the patient portal called "MyChart".  Sign up information is provided on this After Visit Summary.  MyChart is used to connect with patients for Virtual Visits (Telemedicine).  Patients are able to view lab/test results, encounter notes, upcoming appointments, etc.  Non-urgent messages can be sent to your provider as well.   To learn more about what you can do with MyChart, go to [email or web address].    Your next appointment:   12 month(s)  The format for your next appointment:   In Person  Provider:   Eleonore Chiquito, MD or Sande Rives, PA-C, or Almyra Deforest, [place] {

## 2021-10-04 ENCOUNTER — Encounter: Payer: Self-pay | Admitting: *Deleted

## 2021-10-04 DIAGNOSIS — C50412 Malignant neoplasm of upper-outer quadrant of left female breast: Secondary | ICD-10-CM | POA: Insufficient documentation

## 2021-10-04 DIAGNOSIS — Z170 Estrogen receptor positive status [ER+]: Secondary | ICD-10-CM

## 2021-10-04 NOTE — Progress Notes (Signed)
Radiation Oncology         [phone] ________________________________  Name: Meghan Hoffman        MRN: [id number]  Date of Service: 10/06/2021 DOB: [birth year]  OI:NOMVEH, Elta Guadeloupe, MD  Jovita Kussmaul, MD     REFERRING PHYSICIAN: [name], MD   DIAGNOSIS: The encounter diagnosis was Malignant neoplasm of upper-outer quadrant of left breast in female, estrogen receptor positive ([place]).   HISTORY OF PRESENT ILLNESS: Meghan Hoffman is a 76 y.o. female seen in the multidisciplinary breast clinic for a new diagnosis of left breast cancer. The patient was noted to have 2 screening detected masses in the left breast.  When she returned for diagnostic spot compression one of the 2 was no longer visible.  The persistent lesion however at 2:00 measured 4 mm in her axilla by ultrasound was negative.  She underwent biopsy on 09/28/2021 which showed a grade 2 invasive ductal carcinoma that was ER/PR positive, HER2 negative with a Ki-67 of 10%.  She is seen today to discuss treatment recommendations of her cancer.    PREVIOUS RADIATION THERAPY: No   PAST MEDICAL HISTORY:  Past Medical History:  Diagnosis Date   Anemia    "Pernicious Anemia"- injections every 3 weeks -next 11-25-14   Arthritis    Coronary artery disease    Depression    H/O seasonal allergies    Hyperlipidemia    Hypertension    Hypothyroidism    PONV (postoperative nausea and vomiting)    once under general anesthesia       PAST SURGICAL HISTORY: Past Surgical History:  Procedure Laterality Date   ABDOMINAL HYSTERECTOMY     COLONOSCOPY WITH PROPOFOL N/A 11/24/2014   Procedure: COLONOSCOPY WITH PROPOFOL;  Surgeon: Garlan Fair, MD;  Location: [facility];  Service: Endoscopy;  Laterality: N/A;   KNEE SURGERY Left    open age 9   LAPAROSCOPY     x2 ovarian cyst   ORIF WRIST FRACTURE Right    TONSILLECTOMY     age 88     FAMILY HISTORY:  Family History  Problem Relation Age of Onset   Breast cancer  Maternal Aunt    Breast cancer Paternal Grandmother    Heart disease Mother    Heart disease Father 31     SOCIAL HISTORY:  reports that she quit smoking about 23 years ago. Her smoking use included cigarettes. She has a 50.00 pack-year smoking history. She has never used smokeless tobacco. She reports current alcohol use. She reports that she does not use drugs.  The patient is married and lives in [place].  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 She keeps busy with that and spends a lot of time with her husband and Contractor.    ALLERGIES: Patient has no known allergies.   MEDICATIONS:  Current Outpatient Medications  Medication Sig Dispense Refill   atorvastatin (LIPITOR) 20 MG tablet Take 40 mg by mouth daily. 40 MG TOTAL DAILY     B Complex Vitamins (VITAMIN B COMPLEX IJ) Inject as directed every 14 (fourteen) days.      cholecalciferol (VITAMIN D) 1000 UNITS tablet Take 1,000 Units by mouth every evening.     escitalopram (LEXAPRO) 10 MG tablet Take 10 mg by mouth daily.   11   fexofenadine (ALLEGRA) 180 MG tablet Take 180 mg by mouth every evening.     fluticasone (FLONASE) 50 MCG/ACT nasal spray Place 1  spray into both nostrils daily.     hydrochlorothiazide (HYDRODIURIL) 25 MG tablet Take 12.5 mg by mouth every morning.     levothyroxine (SYNTHROID, LEVOTHROID) 125 MCG tablet Take 125 mcg by mouth daily before breakfast.   3   magnesium gluconate (MAGONATE) 500 MG tablet Take 500 mg by mouth daily.     OZEMPIC, 1 MG/DOSE, 4 MG/3ML SOPN Inject into the skin.     potassium chloride (K-DUR,KLOR-CON) 10 MEQ tablet Take 10 mEq by mouth daily.     TURMERIC PO Take 1 tablet by mouth 2 (two) times daily.     No current facility-administered medications for this encounter.    REVIEW OF SYSTEMS: On review of systems, the patient reports that she is doing well overall without any specific complaints or concerns.      PHYSICAL EXAM:  Wt Readings  from Last 3 Encounters:  09/30/21 172 lb 6.4 oz (78.2 kg)  09/14/20 188 lb 6.4 oz (85.5 kg)  06/11/20 185 lb 9.6 oz (84.2 kg)   Temp Readings from Last 3 Encounters:  09/14/20 (!) 97 F (36.1 C)  06/11/20 (!) 93.4 F (34.1 C)  12/03/19 (!) 97.4 F (36.3 C) (Temporal)   BP Readings from Last 3 Encounters:  09/30/21 124/76  09/14/20 (!) 142/91  06/11/20 (!) 150/82   Pulse Readings from Last 3 Encounters:  09/30/21 78  09/14/20 75  06/11/20 73    In general this is a well appearing Caucasian female in no acute distress. She's alert and oriented x4 and appropriate throughout the examination. Cardiopulmonary assessment is negative for acute distress and she exhibits normal effort. Bilateral breast exam is deferred.    ECOG = 0  0 - Asymptomatic (Fully active, able to carry on all predisease activities without restriction)  1 - Symptomatic but completely ambulatory (Restricted in physically strenuous activity but ambulatory and able to carry out work of a light or sedentary nature. For example, light housework, office work)  2 - Symptomatic, <50% in bed during the day (Ambulatory and capable of all self care but unable to carry out any work activities. Up and about more than 50% of waking hours)  3 - Symptomatic, >50% in bed, but not bedbound (Capable of only limited self-care, confined to bed or chair 50% or more of waking hours)  4 - Bedbound (Completely disabled. Cannot carry on any self-care. Totally confined to bed or chair)  5 - Death   Eustace Pen MM, Creech RH, Tormey DC, et al. [phone]). "Toxicity and response criteria of the [facility]". [place] Oncol. 5 (6): 649-55    LABORATORY DATA:  Lab Results  Component Value Date   WBC 4.6 05/14/2008   HGB 13.6 05/14/2008   HCT 40.2 05/14/2008   MCV 95.9 05/14/2008   PLT 221 05/14/2008   Lab Results  Component Value Date   NA 139 05/14/2008   K 3.3 (L) 05/14/2008   CL 102 05/14/2008   CO2 31  05/14/2008   Lab Results  Component Value Date   ALT 23 05/14/2008   AST 29 05/14/2008   ALKPHOS 46 05/14/2008   BILITOT 1.0 05/14/2008      RADIOGRAPHY: US BREAST LTD UNI LEFT INC AXILLA  Result Date: 09/16/2021 CLINICAL DATA:  Screening recall for left breast masses. EXAM: DIGITAL DIAGNOSTIC UNILATERAL LEFT MAMMOGRAM WITH TOMOSYNTHESIS AND CAD; ULTRASOUND LEFT BREAST LIMITED TECHNIQUE: Left digital diagnostic mammography and breast tomosynthesis was performed. The images were evaluated with computer-aided detection.; Targeted  ultrasound examination of the left breast was performed. COMPARISON:  Previous exams. ACR Breast Density Category b: There are scattered areas of fibroglandular density. FINDINGS: Additional tomograms were performed the left breast. There is a small spiculated mass in the upper-outer left breast mid depth measuring 0.4 cm. The additional initially questioned mass seen in the lower inner anterior left breast is less apparent and not seen on the spot compression MLO tomograms possibly related to ducts in this location. Targeted ultrasound of the left breast was performed. A small intramammary lymph node is seen in the left breast at 2 o'clock 5 cm from nipple measuring 0.2 x 0.2 x 0.2 cm. There is an irregular hypoechoic mass in the left breast at 2 o'clock 4 cm from nipple measuring 0.4 x 0.3 x 0.4 cm. This corresponds well with the mass seen in the upper-outer left breast at mammography. Targeted ultrasound of the lower and inner left breast was performed demonstrating areas of dense fibroglandular tissue and slightly prominent ducts. These ducts likely correspond with the initially questioned mass seen in the slightly inner left breast at mammography. No suspicious masses or abnormality seen in the lower inner left breast. No lymphadenopathy seen in the left axilla. IMPRESSION: Suspicious 0.4 cm mass in the left breast at 2 o'clock 4 cm from nipple. RECOMMENDATION: Recommend  ultrasound-guided biopsy of the mass in the left breast at 2 o'clock 4 cm from nipple. I have discussed the findings and recommendations with the patient. If applicable, a reminder letter will be sent to the patient regarding the next appointment. BI-RADS CATEGORY  5: Highly suggestive of malignancy. Electronically Signed   By: Everlean Alstrom M.D.   On: 09/16/2021 16:29  MM DIAG BREAST TOMO UNI LEFT  Result Date: 09/16/2021 CLINICAL DATA:  Screening recall for left breast masses. EXAM: DIGITAL DIAGNOSTIC UNILATERAL LEFT MAMMOGRAM WITH TOMOSYNTHESIS AND CAD; ULTRASOUND LEFT BREAST LIMITED TECHNIQUE: Left digital diagnostic mammography and breast tomosynthesis was performed. The images were evaluated with computer-aided detection.; Targeted ultrasound examination of the left breast was performed. COMPARISON:  Previous exams. ACR Breast Density Category b: There are scattered areas of fibroglandular density. FINDINGS: Additional tomograms were performed the left breast. There is a small spiculated mass in the upper-outer left breast mid depth measuring 0.4 cm. The additional initially questioned mass seen in the lower inner anterior left breast is less apparent and not seen on the spot compression MLO tomograms possibly related to ducts in this location. Targeted ultrasound of the left breast was performed. A small intramammary lymph node is seen in the left breast at 2 o'clock 5 cm from nipple measuring 0.2 x 0.2 x 0.2 cm. There is an irregular hypoechoic mass in the left breast at 2 o'clock 4 cm from nipple measuring 0.4 x 0.3 x 0.4 cm. This corresponds well with the mass seen in the upper-outer left breast at mammography. Targeted ultrasound of the lower and inner left breast was performed demonstrating areas of dense fibroglandular tissue and slightly prominent ducts. These ducts likely correspond with the initially questioned mass seen in the slightly inner left breast at mammography. No suspicious masses or  abnormality seen in the lower inner left breast. No lymphadenopathy seen in the left axilla. IMPRESSION: Suspicious 0.4 cm mass in the left breast at 2 o'clock 4 cm from nipple. RECOMMENDATION: Recommend ultrasound-guided biopsy of the mass in the left breast at 2 o'clock 4 cm from nipple. I have discussed the findings and recommendations with the patient. If applicable, a  reminder letter will be sent to the patient regarding the next appointment. BI-RADS CATEGORY  5: Highly suggestive of malignancy. Electronically Signed   By: Everlean Alstrom M.D.   On: 09/16/2021 16:29  MM CLIP PLACEMENT LEFT  Result Date: 09/28/2021 CLINICAL DATA:  Patient status post ultrasound-guided biopsy left breast mass. EXAM: 3D DIAGNOSTIC LEFT MAMMOGRAM POST ULTRASOUND BIOPSY COMPARISON:  Previous exam(s). FINDINGS: 3D Mammographic images were obtained following ultrasound guided biopsy of left breast mass. The biopsy marking clip is in expected position at the site of biopsy. IMPRESSION: Appropriate positioning of the ribbon shaped biopsy marking clip at the site of biopsy in the upper-outer left breast. Final Assessment: Post Procedure Mammograms for Marker Placement Electronically Signed   By: Lovey Newcomer M.D.   On: 09/28/2021 09:42  [place] LT BREAST BX W LOC DEV 1ST LESION IMG BX SPEC US GUIDE  Addendum Date: 09/30/2021   ADDENDUM REPORT: 09/30/2021 08:38 ADDENDUM: Pathology revealed GRADE II INVASIVE DUCTAL CARCINOMA WITH CALCIFICATIONS of the LEFT breast, 2 o'clock, 4cmfn (ribbon clip). This was found to be concordant by Dr. Lovey Newcomer. Pathology results were discussed with the patient by telephone by Stacie Acres, RN Nurse Navigator. The patient reported doing well after the biopsy with tenderness at the site. Post biopsy instructions and care were reviewed and questions were answered. The patient was encouraged to call [facility] for any additional concerns. The patient was referred to [facility] on October 06, 2021. Pathology results reported by Terie Purser, RN on 09/30/2021. Electronically Signed   By: Lovey Newcomer M.D.   On: 09/30/2021 08:38   Result Date: 09/30/2021 CLINICAL DATA:  Patient status post ultrasound-guided biopsy left breast mass. EXAM: ULTRASOUND GUIDED LEFT BREAST CORE NEEDLE BIOPSY COMPARISON:  Previous exam(s). PROCEDURE: I met with the patient and we discussed the procedure of ultrasound-guided biopsy, including benefits and alternatives. We discussed the high likelihood of a successful procedure. We discussed the risks of the procedure, including infection, bleeding, tissue injury, clip migration, and inadequate sampling. Informed written consent was given. The usual time-out protocol was performed immediately prior to the procedure. Lesion quadrant: Upper outer quadrant Using sterile technique and 1% Lidocaine as local anesthetic, under direct ultrasound visualization, a 14 gauge spring-loaded device was used to perform biopsy of left breast mass 2 o'clock position using a lateral approach. At the conclusion of the procedure ribbon tissue marker clip was deployed into the biopsy cavity. Follow up 2 view mammogram was performed and dictated separately. IMPRESSION: Ultrasound guided biopsy of left breast mass. No apparent complications. Electronically Signed: By: Lovey Newcomer M.D. On: 09/28/2021 09:38      IMPRESSION/PLAN: 1. Stage IA, cT1aN0M0, grade 2, ER/PR positive invasive ductal carcinoma of the left breast.Dr. Lisbeth Renshaw discusses the pathology findings and reviews the nature of early stage left breast disease. The consensus from the breast conference includes breast conservation with lumpectomy.  Dr. Lisbeth Renshaw discusses the rationale for external radiotherapy to the breast  to reduce risks of local recurrence followed by antiestrogen therapy. Dr. Lisbeth Renshaw also discusses cases in which radiation may be optional  for favorable cases based on final pathology. We discussed the risks, benefits, short, and long term effects of radiotherapy, as well as the curative intent, and the patient is interested in proceeding as she is interested in remaining aggressive about her care. Dr. Lisbeth Renshaw discusses the delivery and logistics of radiotherapy and anticipates a course  of 4 weeks of radiotherapy to the left breast with deep inspiration breath-hold technique. We will see her back a few weeks after surgery to discuss the simulation process and anticipate we starting radiotherapy about 4-6 weeks after surgery.  2. Possible genetic predisposition to malignancy. The patient is a candidate for genetic testing given her personal and family history.  She was offered referral and agreed to evaluation in genetics clinic today.   In a visit lasting 60 minutes, greater than 50% of the time was spent face to face reviewing her case, as well as in preparation of, discussing, and coordinating the patient's care.  The above documentation reflects my direct findings during this shared patient visit. Please see the separate note by Dr. Lisbeth Renshaw on this date for the remainder of the patient's plan of care.    Carola Rhine, [facility]    **Disclaimer: This note was dictated with voice recognition software. Similar sounding words can inadvertently be transcribed and this note may contain transcription errors which may not have been corrected upon publication of note.**

## 2021-10-05 NOTE — Progress Notes (Signed)
[place] NOTE  Patient Care Team: Crist Infante, MD as PCP - General (Internal Medicine) Jovita Kussmaul, MD as Consulting Physician (General Surgery) Nicholas Lose, MD as Consulting Physician (Hematology and Oncology) Kyung Rudd, MD as Consulting Physician (Radiation Oncology) Mauro Kaufmann, RN as Oncology Nurse Navigator Rockwell Germany, RN as Oncology Nurse Navigator  CHIEF COMPLAINTS/PURPOSE OF CONSULTATION:  Newly diagnosed breast cancer  HISTORY OF PRESENTING ILLNESS:  Meghan Hoffman 76 y.o. female is here because of recent diagnosis of invasive ductal carcinoma of the left breast. Screening mammogram on 08/16/2021 showed masses in the left breast. Diagnostic mammogram and [place] on 09/16/2021 showed suspicious 0.4 cm mass in the left breast at 2 o'clock 4 cm from nipple. Biopsy on 09/28/2021 showed invasive ductal carcinoma with calcifications, ER/PR+(100%). She presents to the clinic today for initial evaluation and discussion of treatment options.   I reviewed her records extensively and collaborated the history with the patient.  SUMMARY OF ONCOLOGIC HISTORY: Oncology History  Malignant neoplasm of upper-outer quadrant of left breast in female, estrogen receptor positive ([place])  09/28/2021 Initial Diagnosis   Screening detected left breast masses.  On diagnostic mammogram only 1 persistent at 2 o'clock position 0.4 cm, axilla negative, ultrasound biopsy: Grade 2 IDC ER 100%, PR 100%, HER2 negative, Ki-67 10%   10/06/2021 Cancer Staging   Staging form: Breast, AJCC 8th Edition - Clinical stage from 10/06/2021: Stage IA (cT1a, cN0, cM0, G2, ER+, PR+, HER2-) - Signed by Nicholas Lose, MD on 10/06/2021 Stage prefix: Initial diagnosis Histologic grading system: 3 grade system      MEDICAL HISTORY:  Past Medical History:  Diagnosis Date   Anemia    "Pernicious Anemia"- injections every 3 weeks -next 11-25-14   Arthritis    Coronary artery disease     Depression    H/O seasonal allergies    Hyperlipidemia    Hypertension    Hypothyroidism    PONV (postoperative nausea and vomiting)    once under general anesthesia    SURGICAL HISTORY: Past Surgical History:  Procedure Laterality Date   ABDOMINAL HYSTERECTOMY     COLONOSCOPY WITH PROPOFOL N/A 11/24/2014   Procedure: COLONOSCOPY WITH PROPOFOL;  Surgeon: Garlan Fair, MD;  Location: [facility];  Service: Endoscopy;  Laterality: N/A;   KNEE SURGERY Left    open age 55   LAPAROSCOPY     x2 ovarian cyst   ORIF WRIST FRACTURE Right    TONSILLECTOMY     age 50    SOCIAL HISTORY: Social History   Socioeconomic History   Marital status: Married    Spouse name: Not on file   Number of children: 2   Years of education: Not on file   Highest education level: Not on file  Occupational History   Occupation: retired  Tobacco Use   Smoking status: Former    Packs/day: 2.00    Years: 25.00    Pack years: 50.00    Types: Cigarettes    Quit date: 11/13/1997    Years since quitting: 23.9   Smokeless tobacco: Never  Substance and Sexual Activity   Alcohol use: Yes    Comment: 3 glasses wine daily   Drug use: No   Sexual activity: Not on file  Other Topics Concern   Not on file  Social History Narrative   Not on file   Social Determinants of Health   Financial Resource Strain: Not on file  Food Insecurity: Not on file  Transportation Needs: Not on file  Physical Activity: Not on file  Stress: Not on file  Social Connections: Not on file  Intimate Partner Violence: Not on file    FAMILY HISTORY: Family History  Problem Relation Age of Onset   Breast cancer Maternal Aunt    Breast cancer Paternal Grandmother    Heart disease Mother    Heart disease Father 41    ALLERGIES:  has No Known Allergies.  MEDICATIONS:  Current Outpatient Medications  Medication Sig Dispense Refill   atorvastatin (LIPITOR) 20 MG tablet Take 40 mg by mouth daily. 40 MG TOTAL DAILY      B Complex Vitamins (VITAMIN B COMPLEX IJ) Inject as directed every 14 (fourteen) days.      cholecalciferol (VITAMIN D) 1000 UNITS tablet Take 1,000 Units by mouth every evening.     escitalopram (LEXAPRO) 10 MG tablet Take 10 mg by mouth daily.   11   fexofenadine (ALLEGRA) 180 MG tablet Take 180 mg by mouth every evening.     fluticasone (FLONASE) 50 MCG/ACT nasal spray Place 1 spray into both nostrils daily.     hydrochlorothiazide (HYDRODIURIL) 25 MG tablet Take 12.5 mg by mouth every morning.     levothyroxine (SYNTHROID, LEVOTHROID) 125 MCG tablet Take 125 mcg by mouth daily before breakfast.   3   magnesium gluconate (MAGONATE) 500 MG tablet Take 500 mg by mouth daily.     OZEMPIC, 1 MG/DOSE, 4 MG/3ML SOPN Inject into the skin.     potassium chloride (K-DUR,KLOR-CON) 10 MEQ tablet Take 10 mEq by mouth daily.     TURMERIC PO Take 1 tablet by mouth 2 (two) times daily.     No current facility-administered medications for this visit.    REVIEW OF SYSTEMS:   Constitutional: Denies fevers, chills or abnormal night sweats Eyes: Denies blurriness of vision, double vision or watery eyes Ears, nose, mouth, throat, and face: Denies mucositis or sore throat Respiratory: Denies cough, dyspnea or wheezes Cardiovascular: Denies palpitation, chest discomfort or lower extremity swelling Gastrointestinal:  Denies nausea, heartburn or change in bowel habits Skin: Denies abnormal skin rashes Lymphatics: Denies new lymphadenopathy or easy bruising Neurological:Denies numbness, tingling or new weaknesses Behavioral/Psych: Mood is stable, no new changes  Breast:  Denies any palpable lumps or discharge All other systems were reviewed with the patient and are negative.  PHYSICAL EXAMINATION: ECOG PERFORMANCE STATUS: 1 - Symptomatic but completely ambulatory  Vitals:   10/06/21 1238  BP: (!) 162/83  Pulse: 62  Resp: 16  Temp: (!) 97.3 F (36.3 C)  SpO2: 99%   Filed Weights   10/06/21 1238   Weight: 174 lb 1.6 oz (79 kg)     LABORATORY DATA:  I have reviewed the data as listed Lab Results  Component Value Date   WBC 5.3 10/06/2021   HGB 14.1 10/06/2021   HCT 42.2 10/06/2021   MCV 94.6 10/06/2021   PLT 219 10/06/2021   Lab Results  Component Value Date   NA 138 10/06/2021   K 3.7 10/06/2021   CL 100 10/06/2021   CO2 31 10/06/2021    RADIOGRAPHIC STUDIES: I have personally reviewed the radiological reports and agreed with the findings in the report.  ASSESSMENT AND PLAN:  Malignant neoplasm of upper-outer quadrant of left breast in female, estrogen receptor positive ([place]) 09/28/2021:Screening detected left breast masses.  On diagnostic mammogram only 1 persistent at 2 o'clock position 0.4 cm, axilla negative, ultrasound biopsy: Grade 2 IDC ER 100%, PR  100%, HER2 negative, Ki-67 10%  Pathology and radiology counseling:Discussed with the patient, the details of pathology including the type of breast cancer,the clinical staging, the significance of ER, PR and HER-2/neu receptors and the implications for treatment. After reviewing the pathology in detail, we proceeded to discuss the different treatment options between surgery, radiation and, antiestrogen therapies.  Recommendations: 1. Breast conserving surgery followed by 2. +/- Adjuvant radiation therapy followed by 3. Adjuvant antiestrogen therapy  Return to clinic after surgery to discuss final pathology report   All questions were answered. The patient knows to call the clinic with any problems, questions or concerns.   Rulon Eisenmenger, MD, MPH 10/06/2021    I, Thana Ates, am acting as scribe for Nicholas Lose, MD.  I have reviewed the above documentation for accuracy and completeness, and I agree with the above.

## 2021-10-06 ENCOUNTER — Ambulatory Visit: Payer: Self-pay | Admitting: General Surgery

## 2021-10-06 ENCOUNTER — Other Ambulatory Visit: Payer: Self-pay

## 2021-10-06 ENCOUNTER — Inpatient Hospital Stay (HOSPITAL_BASED_OUTPATIENT_CLINIC_OR_DEPARTMENT_OTHER): Payer: Medicare Other | Admitting: Hematology and Oncology

## 2021-10-06 ENCOUNTER — Encounter: Payer: Self-pay | Admitting: Hematology and Oncology

## 2021-10-06 ENCOUNTER — Inpatient Hospital Stay: Payer: Medicare Other | Attending: Hematology and Oncology

## 2021-10-06 ENCOUNTER — Ambulatory Visit: Payer: Medicare Other | Admitting: Physical Therapy

## 2021-10-06 ENCOUNTER — Encounter: Payer: Self-pay | Admitting: General Practice

## 2021-10-06 ENCOUNTER — Ambulatory Visit (HOSPITAL_BASED_OUTPATIENT_CLINIC_OR_DEPARTMENT_OTHER): Payer: Medicare Other | Admitting: Genetic Counselor

## 2021-10-06 ENCOUNTER — Ambulatory Visit
Admission: RE | Admit: 2021-10-06 | Discharge: 2021-10-06 | Disposition: A | Discharge status: Home / Self Care | Payer: Medicare Other | Source: Ambulatory Visit | Attending: Radiation Oncology | Admitting: Radiation Oncology

## 2021-10-06 DIAGNOSIS — Z803 Family history of malignant neoplasm of breast: Secondary | ICD-10-CM | POA: Diagnosis not present

## 2021-10-06 DIAGNOSIS — C50412 Malignant neoplasm of upper-outer quadrant of left female breast: Secondary | ICD-10-CM | POA: Diagnosis not present

## 2021-10-06 DIAGNOSIS — Z7982 Long term (current) use of aspirin: Secondary | ICD-10-CM | POA: Diagnosis not present

## 2021-10-06 DIAGNOSIS — I10 Essential (primary) hypertension: Secondary | ICD-10-CM

## 2021-10-06 DIAGNOSIS — Z79899 Other long term (current) drug therapy: Secondary | ICD-10-CM | POA: Diagnosis not present

## 2021-10-06 DIAGNOSIS — E039 Hypothyroidism, unspecified: Secondary | ICD-10-CM | POA: Insufficient documentation

## 2021-10-06 DIAGNOSIS — E785 Hyperlipidemia, unspecified: Secondary | ICD-10-CM | POA: Insufficient documentation

## 2021-10-06 DIAGNOSIS — Z87891 Personal history of nicotine dependence: Secondary | ICD-10-CM | POA: Diagnosis not present

## 2021-10-06 DIAGNOSIS — Z170 Estrogen receptor positive status [ER+]: Secondary | ICD-10-CM

## 2021-10-06 LAB — CBC WITH DIFFERENTIAL (CANCER CENTER ONLY)
Abs Immature Granulocytes: 0.02 10*3/uL (ref 0.00–0.07)
Basophils Absolute: 0 10*3/uL (ref 0.0–0.1)
Basophils Relative: 0 %
Eosinophils Absolute: 0.1 10*3/uL (ref 0.0–0.5)
Eosinophils Relative: 1 %
HCT: 42.2 % (ref 36.0–46.0)
Hemoglobin: 14.1 g/dL (ref 12.0–15.0)
Immature Granulocytes: 0 %
Lymphocytes Relative: 31 %
Lymphs Abs: 1.6 10*3/uL (ref 0.7–4.0)
MCH: 31.6 pg (ref 26.0–34.0)
MCHC: 33.4 g/dL (ref 30.0–36.0)
MCV: 94.6 fL (ref 80.0–100.0)
Monocytes Absolute: 0.6 10*3/uL (ref 0.1–1.0)
Monocytes Relative: 11 %
Neutro Abs: 2.9 10*3/uL (ref 1.7–7.7)
Neutrophils Relative %: 57 %
Platelet Count: 219 10*3/uL (ref 150–400)
RBC: 4.46 MIL/uL (ref 3.87–5.11)
RDW: 13.3 % (ref 11.5–15.5)
WBC Count: 5.3 10*3/uL (ref 4.0–10.5)
nRBC: 0 % (ref 0.0–0.2)

## 2021-10-06 LAB — CMP (CANCER CENTER ONLY)
ALT: 13 U/L (ref 0–44)
AST: 19 U/L (ref 15–41)
Albumin: 4.2 g/dL (ref 3.5–5.0)
Alkaline Phosphatase: 72 U/L (ref 38–126)
Anion gap: 7 (ref 5–15)
BUN: 18 mg/dL (ref 8–23)
CO2: 31 mmol/L (ref 22–32)
Calcium: 9.9 mg/dL (ref 8.9–10.3)
Chloride: 100 mmol/L (ref 98–111)
Creatinine: 0.63 mg/dL (ref 0.44–1.00)
GFR, Estimated: >60 mL/min (ref >60)
Glucose, Bld: 82 mg/dL (ref 70–99)
Potassium: 3.7 mmol/L (ref 3.5–5.1)
Sodium: 138 mmol/L (ref 135–145)
Total Bilirubin: 0.7 mg/dL (ref 0.3–1.2)
Total Protein: 7.5 g/dL (ref 6.5–8.1)

## 2021-10-06 LAB — GENETIC SCREENING ORDER

## 2021-10-06 NOTE — Assessment & Plan Note (Signed)
09/28/2021:Screening detected left breast masses.  On diagnostic mammogram only 1 persistent at 2 o'clock position 0.4 cm, axilla negative, ultrasound biopsy: Grade 2 IDC ER 100%, PR 100%, HER2 negative, Ki-67 10%  Pathology and radiology counseling:Discussed with the patient, the details of pathology including the type of breast cancer,the clinical staging, the significance of ER, PR and HER-2/neu receptors and the implications for treatment. After reviewing the pathology in detail, we proceeded to discuss the different treatment options between surgery, radiation and, antiestrogen therapies.  Recommendations: 1. Breast conserving surgery followed by 2. +/- Adjuvant radiation therapy followed by 3. Adjuvant antiestrogen therapy  Return to clinic after surgery to discuss final pathology report

## 2021-10-06 NOTE — Progress Notes (Signed)
[facility] Psychosocial Distress Screening °Spiritual Care ° °Met with Meghan Hoffman and her husband Meghan Hoffman in [facility] to introduce Support Center team/resources, reviewing distress screen per protocol.  The patient scored a 2 on the Psychosocial Distress Thermometer which indicates mild distress. Also assessed for distress and other psychosocial needs.  ° °ONCBCN DISTRESS SCREENING 10/06/2021  °Screening Type Initial Screening  °Distress experienced in past week (1-10) 2  °Emotional problem type Adjusting to illness  °Information Concerns Type Lack of info about treatment  °Referral to support programs Yes  ° °Meghan Hoffman and Meghan Hoffman were welcoming of Spiritual Care visit. She reports little distress, good family support, and a readiness to take action. She is part of the [facility] community, which is providing meaningful pastoral support. Introduced Spiritual Care and [facility] programming as additional layers of support. ° ° °Follow up needed: No. Meghan Hoffman is aware of ongoing Spiritual Care and [facility] programming availability and plans to reach out as needed/desired. ° ° °Chaplain Lisa Lundeen, MDiv, [facility] °Pager [phone] °Voicemail [phone] ° ° ° ° °  °

## 2021-10-07 ENCOUNTER — Other Ambulatory Visit: Payer: Self-pay

## 2021-10-07 ENCOUNTER — Encounter: Payer: Self-pay | Admitting: Genetic Counselor

## 2021-10-07 ENCOUNTER — Other Ambulatory Visit: Payer: Self-pay | Admitting: General Surgery

## 2021-10-07 ENCOUNTER — Encounter (HOSPITAL_BASED_OUTPATIENT_CLINIC_OR_DEPARTMENT_OTHER): Payer: Self-pay | Admitting: General Surgery

## 2021-10-07 DIAGNOSIS — Z170 Estrogen receptor positive status [ER+]: Secondary | ICD-10-CM

## 2021-10-07 DIAGNOSIS — Z803 Family history of malignant neoplasm of breast: Secondary | ICD-10-CM

## 2021-10-07 DIAGNOSIS — C50412 Malignant neoplasm of upper-outer quadrant of left female breast: Secondary | ICD-10-CM

## 2021-10-07 HISTORY — DX: Family history of malignant neoplasm of breast: Z80.3

## 2021-10-07 NOTE — Progress Notes (Signed)
REFERRING PROVIDER: Nicholas Lose, MD [place],  [place] [address]  PRIMARY PROVIDER:  Crist Infante, MD  PRIMARY REASON FOR VISIT:  1. Malignant neoplasm of upper-outer quadrant of left breast in female, estrogen receptor positive ([place])   2. Family history of breast cancer     HISTORY OF PRESENT ILLNESS:   Meghan Hoffman, a 76 y.o. female, was seen for a [place] cancer genetics consultation during the breast multidisciplinary clinic at the request of Dr. Lindi Adie due to a personal and family history of breast cancer.  Ms. Kettering presents to clinic today to discuss the possibility of a hereditary predisposition to cancer, to discuss genetic testing, and to further clarify her future cancer risks, as well as potential cancer risks for family members.   In January 2023, at the age of 76, Ms. Edwards was diagnosed with invasive ductal carcinoma of the left breast (ER+/PR+/HER2-). The preliminary treatment plan includes breast conserving surgery, consideration for adjuvant radiation, and anti-estrogens.   CANCER HISTORY:  Oncology History  Malignant neoplasm of upper-outer quadrant of left breast in female, estrogen receptor positive ([place])  09/28/2021 Initial Diagnosis   Screening detected left breast masses.  On diagnostic mammogram only 1 persistent at 2 o'clock position 0.4 cm, axilla negative, ultrasound biopsy: Grade 2 IDC ER 100%, PR 100%, HER2 negative, Ki-67 10%   10/06/2021 Cancer Staging   Staging form: Breast, AJCC 8th Edition - Clinical stage from 10/06/2021: Stage IA (cT1a, cN0, cM0, G2, ER+, PR+, HER2-) - Signed by Nicholas Lose, MD on 10/06/2021 Stage prefix: Initial diagnosis Histologic grading system: 3 grade system       Past Medical History:  Diagnosis Date   Anemia    "Pernicious Anemia"- injections every 3 weeks -next 11-25-14   Arthritis    Breast cancer ([place])    Coronary artery disease    Depression    Family history of breast cancer 10/07/2021    H/O seasonal allergies    Hyperlipidemia    Hypertension    Hypothyroidism    PONV (postoperative nausea and vomiting)    once under general anesthesia    Past Surgical History:  Procedure Laterality Date   ABDOMINAL HYSTERECTOMY     COLONOSCOPY WITH PROPOFOL N/A 11/24/2014   Procedure: COLONOSCOPY WITH PROPOFOL;  Surgeon: Garlan Fair, MD;  Location: [facility];  Service: Endoscopy;  Laterality: N/A;   KNEE SURGERY Left    open age 59   LAPAROSCOPY     x2 ovarian cyst   ORIF WRIST FRACTURE Right    TONSILLECTOMY     age 18    FAMILY HISTORY:  We obtained a detailed, 4-generation family history.  Significant diagnoses are listed below: Family History  Problem Relation Age of Onset   Breast cancer Maternal Aunt        dx 9s   Breast cancer Paternal Grandmother        dx 2s    Ms. Pienta is unaware of previous family history of genetic testing for hereditary cancer risks. There is no reported Ashkenazi Jewish ancestry. There is no known consanguinity.  GENETIC COUNSELING ASSESSMENT: Ms. Trevizo is a 76 y.o. female with a personal and family history of breast cancer which is somewhat suggestive of a hereditary cancer syndrome and predisposition to cancer given the presence of related cancers in her family and the age of diagnosis of her maternal aunt. We, therefore, discussed and recommended the following at today's visit.   DISCUSSION: We discussed that 5 -  10% of cancer is hereditary.  Most cases of hereditary breast cancer associated with mutations in BRCA1/2.  There are other genes that can be associated with hereditary breast cancer syndromes.  We discussed that testing is beneficial for several reasons including knowing how to follow individuals for their cancer risks, identifying whether potential treatment options would be beneficial, and understanding if other family members could be at risk for cancer and allowing them to undergo genetic testing.   We reviewed the  characteristics, features and inheritance patterns of hereditary cancer syndromes. We also discussed genetic testing, including the appropriate family members to test, the process of testing, insurance coverage and turn-around-time for results. We discussed the implications of a negative, positive, carrier and/or variant of uncertain significant result. We recommended Ms. Bailly pursue genetic testing for a panel that includes genes associated with breast cancer.   Ms. Goyne  was offered a common hereditary cancer panel (47 genes) and an expanded pan-cancer panel (77 genes). Ms. Dolbow was informed of the benefits and limitations of each panel, including that expanded pan-cancer panels contain genes that do not have clear management guidelines at this point in time.  We also discussed that as the number of genes included on a panel increases, the chances of variants of uncertain significance increases.  After considering the benefits and limitations of each gene panel, Ms. Ackerman  elected to have an expanded Radio broadcast assistant through [place].  The CancerNext-Expanded gene panel offered by [facility] and includes sequencing, rearrangement, and RNA analysis for the following 77 genes: AIP, ALK, APC, ATM, AXIN2, BAP1, BARD1, BLM, BMPR1A, BRCA1, BRCA2, BRIP1, CDC73, CDH1, CDK4, CDKN1B, CDKN2A, CHEK2, CTNNA1, DICER1, FANCC, FH, FLCN, GALNT12, KIF1B, LZTR1, MAX, MEN1, MET, MLH1, MSH2, MSH3, MSH6, MUTYH, NBN, NF1, NF2, NTHL1, PALB2, PHOX2B, PMS2, POT1, PRKAR1A, PTCH1, PTEN, RAD51C, RAD51D, RB1, RECQL, RET, SDHA, SDHAF2, SDHB, SDHC, SDHD, SMAD4, SMARCA4, SMARCB1, SMARCE1, STK11, SUFU, TMEM127, TP53, TSC1, TSC2, VHL and XRCC2 (sequencing and deletion/duplication); EGFR, EGLN1, HOXB13, KIT, MITF, PDGFRA, POLD1, and POLE (sequencing only); EPCAM and GREM1 (deletion/duplication only).   Based on Ms. Venier's personal and family history of breast cancer, she meets medical criteria for genetic testing. Despite that she meets  criteria, she may still have an out of pocket cost. We discussed that if her out of pocket cost for testing is over $100, the laboratory should contact her and discuss the self-pay prices and/or patient pay assistance programs.    PLAN: After considering the risks, benefits, and limitations, Ms. Frommer provided informed consent to pursue genetic testing and the blood sample was sent to [facility] for analysis of the CancerNext-Expanded +RNAinsight. Results should be available within approximately 3 weeks' time, at which point they will be disclosed by telephone to Ms. Laurance Flatten, as will any additional recommendations warranted by these results. Ms. Kluth will receive a summary of her genetic counseling visit and a copy of her results once available. This information will also be available in Epic.   Lastly, we encouraged Ms. Mohrmann to remain in contact with cancer genetics annually so that we can continuously update the family history and inform her of any changes in cancer genetics and testing that may be of benefit for this family.   Ms. Pucciarelli questions were answered to her satisfaction today. Our contact information was provided should additional questions or concerns arise. Thank you for the referral and allowing [place] to share in the care of your patient.   Kennedy Bohanon M. Joette Catching, [place], [facility] Genetic Counselor [email or web address] (  P) [phone]  The patient was seen for a total of 20 minutes in face-to-face genetic counseling.  The patient was accompanied by her husband, Dominica Severin. Drs. Lindi Adie and/or Burr Medico were available to discuss this case as needed.   _______________________________________________________________________ For Office Staff:  Number of people involved in session: 2 Was an Intern/ student involved with case: no

## 2021-10-08 ENCOUNTER — Telehealth: Payer: Self-pay | Admitting: Hematology and Oncology

## 2021-10-08 NOTE — Telephone Encounter (Signed)
Sch per 2/3 inbasket, pt aware °

## 2021-10-12 ENCOUNTER — Other Ambulatory Visit: Payer: Self-pay

## 2021-10-12 ENCOUNTER — Encounter (HOSPITAL_BASED_OUTPATIENT_CLINIC_OR_DEPARTMENT_OTHER)
Admission: RE | Admit: 2021-10-12 | Discharge: 2021-10-12 | Disposition: A | Discharge status: Home / Self Care | Payer: Medicare Other | Source: Ambulatory Visit | Attending: General Surgery | Admitting: General Surgery

## 2021-10-12 DIAGNOSIS — Z01810 Encounter for preprocedural cardiovascular examination: Secondary | ICD-10-CM | POA: Diagnosis not present

## 2021-10-12 MED ORDER — CHLORHEXIDINE GLUCONATE CLOTH 2 % EX PADS: 6.0000 | MEDICATED_PAD | Freq: Once | CUTANEOUS | Status: DC

## 2021-10-12 NOTE — Progress Notes (Signed)
° ° ° ° °  Enhanced Recovery after Surgery  Enhanced Recovery after Surgery is a protocol used to improve the stress on your body and your recovery after surgery.  Patient Instructions  The night before surgery:  No food after midnight. ONLY clear liquids after midnight  The day of surgery (if you do NOT have diabetes):  Drink ONE (1) Pre-Surgery Clear Ensure as directed.   This drink was given to you during your hospital  pre-op appointment visit. The pre-op nurse will instruct you on the time to drink the  Pre-Surgery Ensure depending on your surgery time. Finish the drink at the designated time by the pre-op nurse.  Nothing else to drink after completing the  Pre-Surgery Clear Ensure.  The day of surgery (if you have diabetes): Drink ONE (1) Gatorade 2 (G2) as directed. This drink was given to you during your hospital  pre-op appointment visit.  The pre-op nurse will instruct you on the time to drink the   Gatorade 2 (G2) depending on your surgery time. Color of the Gatorade may vary. Red is not allowed. Nothing else to drink after completing the  Gatorade 2 (G2).         If you have questions, please contact your surgeon's office.  Surgical soap given with written instructions 

## 2021-10-14 ENCOUNTER — Telehealth: Payer: Self-pay | Admitting: *Deleted

## 2021-10-14 ENCOUNTER — Encounter: Payer: Self-pay | Admitting: *Deleted

## 2021-10-14 DIAGNOSIS — C50412 Malignant neoplasm of upper-outer quadrant of left female breast: Secondary | ICD-10-CM

## 2021-10-14 DIAGNOSIS — Z170 Estrogen receptor positive status [ER+]: Secondary | ICD-10-CM

## 2021-10-14 NOTE — Telephone Encounter (Signed)
Spoke to pt concerning Meghan Hoffman from 2.1.23. Denies questions or concerns regarding dx or treatment care plan. Encourage pt to call with needs. Received verbal understanding. Contact information provided.

## 2021-10-15 ENCOUNTER — Telehealth: Payer: Self-pay | Admitting: Radiation Oncology

## 2021-10-15 ENCOUNTER — Ambulatory Visit
Admission: RE | Admit: 2021-10-15 | Discharge: 2021-10-15 | Disposition: A | Discharge status: Home / Self Care | Payer: Medicare Other | Source: Ambulatory Visit | Attending: General Surgery | Admitting: General Surgery

## 2021-10-15 DIAGNOSIS — C50412 Malignant neoplasm of upper-outer quadrant of left female breast: Secondary | ICD-10-CM

## 2021-10-15 DIAGNOSIS — Z170 Estrogen receptor positive status [ER+]: Secondary | ICD-10-CM

## 2021-10-15 NOTE — Telephone Encounter (Signed)
2/10 @ 8:47 am spoke to patient would like to call back to schedule.  Patient was unavailable at this time.

## 2021-10-18 ENCOUNTER — Other Ambulatory Visit (HOSPITAL_BASED_OUTPATIENT_CLINIC_OR_DEPARTMENT_OTHER): Payer: Self-pay

## 2021-10-18 ENCOUNTER — Encounter (HOSPITAL_BASED_OUTPATIENT_CLINIC_OR_DEPARTMENT_OTHER)
Admission: RE | Disposition: A | Discharge status: Home / Self Care | Payer: Self-pay | Source: Home / Self Care | Attending: General Surgery

## 2021-10-18 ENCOUNTER — Ambulatory Visit (HOSPITAL_BASED_OUTPATIENT_CLINIC_OR_DEPARTMENT_OTHER): Payer: Medicare Other | Admitting: Anesthesiology

## 2021-10-18 ENCOUNTER — Other Ambulatory Visit: Payer: Self-pay

## 2021-10-18 ENCOUNTER — Encounter (HOSPITAL_BASED_OUTPATIENT_CLINIC_OR_DEPARTMENT_OTHER): Payer: Self-pay | Admitting: General Surgery

## 2021-10-18 ENCOUNTER — Ambulatory Visit (HOSPITAL_BASED_OUTPATIENT_CLINIC_OR_DEPARTMENT_OTHER)
Admission: RE | Admit: 2021-10-18 | Discharge: 2021-10-18 | Disposition: A | Discharge status: Home / Self Care | Payer: Medicare Other | Attending: General Surgery | Admitting: General Surgery

## 2021-10-18 ENCOUNTER — Ambulatory Visit
Admission: RE | Admit: 2021-10-18 | Discharge: 2021-10-18 | Disposition: A | Discharge status: Home / Self Care | Payer: Medicare Other | Source: Ambulatory Visit | Attending: General Surgery | Admitting: General Surgery

## 2021-10-18 DIAGNOSIS — I5030 Unspecified diastolic (congestive) heart failure: Secondary | ICD-10-CM | POA: Insufficient documentation

## 2021-10-18 DIAGNOSIS — I110 Hypertensive heart disease with heart failure: Secondary | ICD-10-CM | POA: Insufficient documentation

## 2021-10-18 DIAGNOSIS — F32A Depression, unspecified: Secondary | ICD-10-CM | POA: Insufficient documentation

## 2021-10-18 DIAGNOSIS — Z170 Estrogen receptor positive status [ER+]: Secondary | ICD-10-CM

## 2021-10-18 DIAGNOSIS — Z7985 Long-term (current) use of injectable non-insulin antidiabetic drugs: Secondary | ICD-10-CM | POA: Diagnosis not present

## 2021-10-18 DIAGNOSIS — Z803 Family history of malignant neoplasm of breast: Secondary | ICD-10-CM | POA: Diagnosis not present

## 2021-10-18 DIAGNOSIS — C50412 Malignant neoplasm of upper-outer quadrant of left female breast: Secondary | ICD-10-CM | POA: Diagnosis present

## 2021-10-18 DIAGNOSIS — D759 Disease of blood and blood-forming organs, unspecified: Secondary | ICD-10-CM | POA: Diagnosis not present

## 2021-10-18 DIAGNOSIS — E039 Hypothyroidism, unspecified: Secondary | ICD-10-CM | POA: Insufficient documentation

## 2021-10-18 DIAGNOSIS — D510 Vitamin B12 deficiency anemia due to intrinsic factor deficiency: Secondary | ICD-10-CM | POA: Diagnosis not present

## 2021-10-18 DIAGNOSIS — C50912 Malignant neoplasm of unspecified site of left female breast: Secondary | ICD-10-CM

## 2021-10-18 HISTORY — PX: BREAST LUMPECTOMY WITH RADIOACTIVE SEED LOCALIZATION: SHX6424

## 2021-10-18 LAB — GLUCOSE, CAPILLARY
Glucose-Capillary: 79 mg/dL (ref 70–99)
Glucose-Capillary: 99 mg/dL (ref 70–99)

## 2021-10-18 SURGERY — BREAST LUMPECTOMY WITH RADIOACTIVE SEED LOCALIZATION
Anesthesia: General | Site: Breast | Laterality: Left

## 2021-10-18 MED ORDER — PROPOFOL 10 MG/ML IV BOLUS
INTRAVENOUS | Status: AC
  Filled 2021-10-18: qty 20

## 2021-10-18 MED ORDER — PROPOFOL 10 MG/ML IV BOLUS
INTRAVENOUS | Status: DC | PRN
  Administered 2021-10-18: 150 mg via INTRAVENOUS

## 2021-10-18 MED ORDER — OXYCODONE HCL 5 MG PO TABS: 5.0000 mg | ORAL_TABLET | Freq: Four times a day (QID) | ORAL | 0 refills | Status: DC | PRN

## 2021-10-18 MED ORDER — FENTANYL CITRATE (PF) 100 MCG/2ML IJ SOLN
25.0000 ug | INTRAMUSCULAR | Status: DC | PRN
  Administered 2021-10-18 (×2): 25 ug via INTRAVENOUS

## 2021-10-18 MED ORDER — GABAPENTIN 300 MG PO CAPS
ORAL_CAPSULE | ORAL | Status: AC
  Filled 2021-10-18: qty 1

## 2021-10-18 MED ORDER — LIDOCAINE HCL (CARDIAC) PF 100 MG/5ML IV SOSY
PREFILLED_SYRINGE | INTRAVENOUS | Status: DC | PRN
  Administered 2021-10-18: 30 mg via INTRAVENOUS

## 2021-10-18 MED ORDER — CEFAZOLIN SODIUM-DEXTROSE 2-3 GM-%(50ML) IV SOLR
INTRAVENOUS | Status: DC | PRN
  Administered 2021-10-18: 2 g via INTRAVENOUS

## 2021-10-18 MED ORDER — ONDANSETRON HCL 4 MG/2ML IJ SOLN
INTRAMUSCULAR | Status: DC | PRN
Start: 2021-10-18 — End: 2021-10-18
  Administered 2021-10-18: 4 mg via INTRAVENOUS

## 2021-10-18 MED ORDER — LACTATED RINGERS IV SOLN: INTRAVENOUS | Status: DC

## 2021-10-18 MED ORDER — ACETAMINOPHEN 500 MG PO TABS
1000.0000 mg | ORAL_TABLET | ORAL | Status: AC
  Administered 2021-10-18: 1000 mg via ORAL

## 2021-10-18 MED ORDER — GABAPENTIN 300 MG PO CAPS
300.0000 mg | ORAL_CAPSULE | ORAL | Status: AC
  Administered 2021-10-18: 300 mg via ORAL

## 2021-10-18 MED ORDER — FENTANYL CITRATE (PF) 100 MCG/2ML IJ SOLN
INTRAMUSCULAR | Status: DC | PRN
  Administered 2021-10-18: 100 ug via INTRAVENOUS

## 2021-10-18 MED ORDER — DEXAMETHASONE SODIUM PHOSPHATE 10 MG/ML IJ SOLN
INTRAMUSCULAR | Status: DC | PRN
  Administered 2021-10-18: 5 mg via INTRAVENOUS

## 2021-10-18 MED ORDER — OXYCODONE HCL 5 MG/5ML PO SOLN: 5.0000 mg | Freq: Once | ORAL | Status: DC | PRN

## 2021-10-18 MED ORDER — OXYCODONE HCL 5 MG PO TABS: 5.0000 mg | ORAL_TABLET | Freq: Once | ORAL | Status: DC | PRN

## 2021-10-18 MED ORDER — CEFAZOLIN SODIUM-DEXTROSE 2-4 GM/100ML-% IV SOLN
INTRAVENOUS | Status: AC
  Filled 2021-10-18: qty 100

## 2021-10-18 MED ORDER — ACETAMINOPHEN 500 MG PO TABS
ORAL_TABLET | ORAL | Status: AC
  Filled 2021-10-18: qty 2

## 2021-10-18 MED ORDER — ONDANSETRON HCL 4 MG/2ML IJ SOLN: 4.0000 mg | Freq: Once | INTRAMUSCULAR | Status: DC | PRN

## 2021-10-18 MED ORDER — AMISULPRIDE (ANTIEMETIC) 5 MG/2ML IV SOLN: 10.0000 mg | Freq: Once | INTRAVENOUS | Status: DC | PRN

## 2021-10-18 MED ORDER — BUPIVACAINE-EPINEPHRINE (PF) 0.25% -1:200000 IJ SOLN
INTRAMUSCULAR | Status: DC | PRN
  Administered 2021-10-18: 18 mL

## 2021-10-18 MED ORDER — CELECOXIB 200 MG PO CAPS
200.0000 mg | ORAL_CAPSULE | ORAL | Status: AC
  Administered 2021-10-18: 200 mg via ORAL

## 2021-10-18 MED ORDER — ONDANSETRON HCL 4 MG/2ML IJ SOLN
INTRAMUSCULAR | Status: AC
  Filled 2021-10-18: qty 2

## 2021-10-18 MED ORDER — CELECOXIB 200 MG PO CAPS
ORAL_CAPSULE | ORAL | Status: AC
  Filled 2021-10-18: qty 1

## 2021-10-18 MED ORDER — LACTATED RINGERS IV SOLN
INTRAVENOUS | Status: DC | PRN
Start: 2021-10-18 — End: 2021-10-18

## 2021-10-18 MED ORDER — CEFAZOLIN SODIUM-DEXTROSE 2-4 GM/100ML-% IV SOLN: 2.0000 g | INTRAVENOUS | Status: DC

## 2021-10-18 MED ORDER — FENTANYL CITRATE (PF) 100 MCG/2ML IJ SOLN
INTRAMUSCULAR | Status: AC
  Filled 2021-10-18: qty 2

## 2021-10-18 MED ORDER — LIDOCAINE 2% (20 MG/ML) 5 ML SYRINGE
INTRAMUSCULAR | Status: AC
  Filled 2021-10-18: qty 5

## 2021-10-18 MED ORDER — DEXAMETHASONE SODIUM PHOSPHATE 10 MG/ML IJ SOLN
INTRAMUSCULAR | Status: AC
  Filled 2021-10-18: qty 1

## 2021-10-18 SURGICAL SUPPLY — 48 items
ADH SKN CLS APL DERMABOND .7 (GAUZE/BANDAGES/DRESSINGS) ×1
APL PRP STRL LF DISP 70% ISPRP (MISCELLANEOUS) ×1
APPLIER CLIP 9.375 MED OPEN (MISCELLANEOUS) ×2
APR CLP MED 9.3 20 MLT OPN (MISCELLANEOUS) ×1
BLADE SURG 15 STRL LF DISP TIS (BLADE) ×2 IMPLANT
BLADE SURG 15 STRL SS (BLADE) ×2
CANISTER SUC SOCK COL 7IN (MISCELLANEOUS) ×2 IMPLANT
CANISTER SUCT 1200ML W/VALVE (MISCELLANEOUS) ×2 IMPLANT
CHLORAPREP W/TINT 26 (MISCELLANEOUS) ×3 IMPLANT
CLIP APPLIE 9.375 MED OPEN (MISCELLANEOUS) IMPLANT
COVER BACK TABLE 60X90IN (DRAPES) ×3 IMPLANT
COVER MAYO STAND STRL (DRAPES) ×3 IMPLANT
COVER PROBE W GEL 5X96 (DRAPES) ×3 IMPLANT
DERMABOND ADVANCED (GAUZE/BANDAGES/DRESSINGS) ×1
DERMABOND ADVANCED .7 DNX12 (GAUZE/BANDAGES/DRESSINGS) ×2 IMPLANT
DRAPE LAPAROSCOPIC ABDOMINAL (DRAPES) ×2 IMPLANT
DRAPE LAPAROTOMY 100X72 PEDS (DRAPES) ×1 IMPLANT
DRAPE UTILITY XL STRL (DRAPES) ×3 IMPLANT
ELECT COATED BLADE 2.86 ST (ELECTRODE) ×3 IMPLANT
ELECT REM PT RETURN 9FT ADLT (ELECTROSURGICAL) ×2
ELECTRODE REM PT RTRN 9FT ADLT (ELECTROSURGICAL) ×2 IMPLANT
GLOVE SURG ENC MOIS LTX SZ7.5 (GLOVE) ×5 IMPLANT
GLOVE SURG POLYISO LF SZ7 (GLOVE) ×1 IMPLANT
GLOVE SURG POLYISO LF SZ7.5 (GLOVE) ×1 IMPLANT
GLOVE SURG UNDER POLY LF SZ7 (GLOVE) ×1 IMPLANT
GOWN STRL REUS W/ TWL LRG LVL3 (GOWN DISPOSABLE) ×4 IMPLANT
GOWN STRL REUS W/ TWL XL LVL3 (GOWN DISPOSABLE) IMPLANT
GOWN STRL REUS W/TWL LRG LVL3 (GOWN DISPOSABLE) ×4
GOWN STRL REUS W/TWL XL LVL3 (GOWN DISPOSABLE) ×2
ILLUMINATOR WAVEGUIDE N/F (MISCELLANEOUS) IMPLANT
KIT MARKER MARGIN INK (KITS) ×3 IMPLANT
LIGHT WAVEGUIDE WIDE FLAT (MISCELLANEOUS) IMPLANT
NDL HYPO 25X1 1.5 SAFETY (NEEDLE) IMPLANT
NEEDLE HYPO 25X1 1.5 SAFETY (NEEDLE) ×2 IMPLANT
NS IRRIG 1000ML POUR BTL (IV SOLUTION) ×1 IMPLANT
PACK BASIN DAY SURGERY FS (CUSTOM PROCEDURE TRAY) ×3 IMPLANT
PENCIL SMOKE EVACUATOR (MISCELLANEOUS) ×3 IMPLANT
SLEEVE SCD COMPRESS KNEE MED (STOCKING) ×3 IMPLANT
SPIKE FLUID TRANSFER (MISCELLANEOUS) IMPLANT
SPONGE T-LAP 18X18 ~~LOC~~+RFID (SPONGE) ×3 IMPLANT
SUT MON AB 4-0 PC3 18 (SUTURE) ×3 IMPLANT
SUT SILK 2 0 SH (SUTURE) IMPLANT
SUT VICRYL 3-0 CR8 SH (SUTURE) ×3 IMPLANT
SYR CONTROL 10ML LL (SYRINGE) ×1 IMPLANT
TOWEL GREEN STERILE FF (TOWEL DISPOSABLE) ×3 IMPLANT
TRAY FAXITRON CT DISP (TRAY / TRAY PROCEDURE) ×3 IMPLANT
TUBE CONNECTING 20X1/4 (TUBING) ×2 IMPLANT
YANKAUER SUCT BULB TIP NO VENT (SUCTIONS) IMPLANT

## 2021-10-18 NOTE — H&P (Signed)
PROVIDER: Landry Corporal, MD  MRN: [id number] DOB: 1946-08-18 Subjective   Chief Complaint: Breast Cancer   History of Present Illness: Meghan Hoffman is a 76 y.o. female who is seen today as an office consultation at the request of Dr. Pasty Arch for evaluation of Breast Cancer .   We are asked to see the patient in consultation by Dr. Crist Infante to evaluate her for a new left breast cancer. The patient is a 76 year old white female who recently went for a routine screening mammogram. At that time she was found to have a 4 mm mass in the upper outer quadrant of the left breast. The axilla looked negative. The mass was biopsied and came back as a grade 2 invasive ductal cancer that was ER and PR positive and HER2 negative with a Ki-67 of 10%. She is otherwise in good health. She does have a family history of breast cancer in a paternal grandmother and a maternal aunt. She does not smoke.  Review of Systems: A complete review of systems was obtained from the patient. I have reviewed this information and discussed as appropriate with the patient. See HPI as well for other ROS.  ROS   Medical History: Past Medical History:  Diagnosis Date   Anemia  Unknown date   Arthritis  Unknown date   Hyperlipidemia   Hypertension   Patient Active Problem List  Diagnosis   Malignant neoplasm of upper-outer quadrant of left breast in female, estrogen receptor positive (CMS-HCC)   Personal history of colonic polyps   Hypothyroidism   Benign essential hypertension   Pernicious anemia   Nasal congestion   Past Surgical History:  Procedure Laterality Date   knee surgery N/A  Unknown date   TONSILLECTOMY N/A  Unknown date    No Known Allergies  Current Outpatient Medications on File Prior to Visit  Medication Sig Dispense Refill   atorvastatin (LIPITOR) 40 MG tablet Take 40 mg by mouth once daily   cholecalciferol (VITAMIN D3) 1000 unit tablet Take 1,000 Units by mouth every evening    escitalopram oxalate (LEXAPRO) 10 MG tablet Take 1 tablet by mouth once daily   fexofenadine (ALLEGRA) 180 MG tablet Take 180 mg by mouth every evening   fluticasone propionate (FLONASE) 50 mcg/actuation nasal spray Place 1 spray into both nostrils once daily   hydroCHLOROthiazide (MICROZIDE) 12.5 mg capsule Take 12.5 mg by mouth every morning   levothyroxine (SYNTHROID) 125 MCG tablet Take 1 tablet by mouth every morning before breakfast (0630)   magnesium gluconate (MAGONATE) 27.5 mg magne- sium (500 mg) tablet Take 500 mg by mouth once daily   potassium chloride (KLOR-CON) 10 mEq ER tablet Take 1 tablet by mouth once daily   semaglutide (OZEMPIC) 1 mg/dose (4 mg/3 mL) pen injector Inject 1 mg subcutaneously once a week   turmeric, bulk, 100 % Powd Take 1 tablet by mouth 2 (two) times daily   vitamin B complex (B COMPLEX VITAMINS INJ) Inject 1 Dose as directed every 14 (fourteen) days   No current facility-administered medications on file prior to visit.   Family History  Problem Relation Age of Onset   Coronary Artery Disease (Blocked arteries around heart) Mother   Hyperlipidemia (Elevated cholesterol) Mother   Diabetes Father   Coronary Artery Disease (Blocked arteries around heart) Father   Hyperlipidemia (Elevated cholesterol) Father    Social History   Tobacco Use  Smoking Status Former   Types: Cigarettes   Quit date: 11/13/1997   Years since  quitting: 23.9  Smokeless Tobacco Never    Social History   Socioeconomic History   Marital status: Married  Tobacco Use   Smoking status: Former  Types: Cigarettes  Quit date: 11/13/1997  Years since quitting: 23.9   Smokeless tobacco: Never  Vaping Use   Vaping Use: Never used  Substance and Sexual Activity   Alcohol use: Yes   Drug use: Never   Objective:  There were no vitals filed for this visit.  There is no height or weight on file to calculate BMI.  Physical Exam Vitals reviewed.  Constitutional:  General:  She is not in acute distress. Appearance: Normal appearance.  HENT:  Head: Normocephalic and atraumatic.  Right Ear: External ear normal.  Left Ear: External ear normal.  Nose: Nose normal.  Mouth/Throat:  Mouth: Mucous membranes are moist.  Pharynx: Oropharynx is clear.  Eyes:  General: No scleral icterus. Extraocular Movements: Extraocular movements intact.  Conjunctiva/sclera: Conjunctivae normal.  Pupils: Pupils are equal, round, and reactive to light.  Cardiovascular:  Rate and Rhythm: Normal rate and regular rhythm.  Pulses: Normal pulses.  Heart sounds: Normal heart sounds.  Pulmonary:  Effort: Pulmonary effort is normal. No respiratory distress.  Breath sounds: Normal breath sounds.  Abdominal:  General: Bowel sounds are normal.  Palpations: Abdomen is soft.  Tenderness: There is no abdominal tenderness.  Musculoskeletal:  General: No swelling, tenderness or deformity. Normal range of motion.  Cervical back: Normal range of motion and neck supple.  Skin: General: Skin is warm and dry.  Coloration: Skin is not jaundiced.  Neurological:  General: No focal deficit present.  Mental Status: She is alert and oriented to person, place, and time.  Psychiatric:  Mood and Affect: Mood normal.  Behavior: Behavior normal.     Breast: There is a palpable bruise in the outer aspect of the left breast. Other than this there is no palpable mass in either breast. There is no palpable axillary, supraclavicular, or cervical lymphadenopathy.  Labs, Imaging and Diagnostic Testing:  Assessment and Plan:   Diagnoses and all orders for this visit:  Malignant neoplasm of upper-outer quadrant of left breast in female, estrogen receptor positive (CMS-HCC)    The patient appears to have a small cancer in the upper outer left breast with clinically negative nodes. I have discussed with the patient in detail the different options for treatment and at this point she favors breast  conservation which we feel is very reasonable. Given her age and the favorable nature of the cancer she will likely not need a node evaluation. I have discussed with her in detail the risks and benefits of the operation to remove the cancer as well as some of the technical aspects including the use of a radioactive seed for localization and she understands and wishes to proceed. She will meet with medical and radiation oncology to discuss adjuvant therapy.

## 2021-10-18 NOTE — Interval H&P Note (Signed)
History and Physical Interval Note:  2/75/1700 1:74 PM  Meghan Hoffman  has presented today for surgery, with the diagnosis of LEFT BREAST CANCER.  The various methods of treatment have been discussed with the patient and family. After consideration of risks, benefits and other options for treatment, the patient has consented to  Procedure(s): LEFT BREAST LUMPECTOMY WITH RADIOACTIVE SEED LOCALIZATION (Left) as a surgical intervention.  The patient's history has been reviewed, patient examined, no change in status, stable for surgery.  I have reviewed the patient's chart and labs.  Questions were answered to the patient's satisfaction.     [name]

## 2021-10-18 NOTE — Discharge Instructions (Signed)
°  Post Anesthesia Home Care Instructions  Activity: Get plenty of rest for the remainder of the day. A responsible individual must stay with you for 24 hours following the procedure.  For the next 24 hours, DO NOT: -Drive a car -Paediatric nurse -Drink alcoholic beverages -Take any medication unless instructed by your physician -Make any legal decisions or sign important papers.  Meals: Start with liquid foods such as gelatin or soup. Progress to regular foods as tolerated. Avoid greasy, spicy, heavy foods. If nausea and/or vomiting occur, drink only clear liquids until the nausea and/or vomiting subsides. Call your physician if vomiting continues.  Special Instructions/Symptoms: Your throat may feel dry or sore from the anesthesia or the breathing tube placed in your throat during surgery. If this causes discomfort, gargle with warm salt water. The discomfort should disappear within 24 hours.  Next dose of Tylenol or Ibuprofen can be taken at 730pm today

## 2021-10-18 NOTE — Anesthesia Procedure Notes (Signed)
Procedure Name: LMA Insertion Date/Time: 10/18/2021 1:58 PM Performed by: Verita Lamb, CRNA Pre-anesthesia Checklist: Patient identified, Emergency Drugs available, Suction available and Patient being monitored Patient Re-evaluated:Patient Re-evaluated prior to induction Oxygen Delivery Method: Circle system utilized Preoxygenation: Pre-oxygenation with 100% oxygen Induction Type: IV induction Ventilation: Mask ventilation without difficulty LMA: LMA inserted LMA Size: 4.0 Number of attempts: 1 Airway Equipment and Method: Bite block Placement Confirmation: positive ETCO2, CO2 detector and breath sounds checked- equal and bilateral Tube secured with: Tape Dental Injury: Teeth and Oropharynx as per pre-operative assessment

## 2021-10-18 NOTE — Anesthesia Postprocedure Evaluation (Signed)
Anesthesia Post Note  Patient: Rheanne Cortopassi  Procedure(s) Performed: LEFT BREAST LUMPECTOMY WITH RADIOACTIVE SEED LOCALIZATION (Left: Breast)     Patient location during evaluation: Phase II Anesthesia Type: General Level of consciousness: awake and alert, patient cooperative and oriented Pain management: pain level controlled Vital Signs Assessment: post-procedure vital signs reviewed and stable Respiratory status: spontaneous breathing, nonlabored ventilation and respiratory function stable Cardiovascular status: blood pressure returned to baseline and stable Postop Assessment: no apparent nausea or vomiting and able to ambulate Anesthetic complications: no   No notable events documented.  Last Vitals:  Vitals:   10/18/21 1510 10/18/21 1518  BP:    Pulse: 73 74  Resp: 11 10  Temp:    SpO2: 99% 99%    Last Pain:  Vitals:   10/18/21 1518  TempSrc:   PainSc: 2                  Jacinto Keil,E. Aleera Gilcrease

## 2021-10-18 NOTE — Anesthesia Preprocedure Evaluation (Addendum)
Anesthesia Evaluation  Patient identified by MRN, date of birth, ID band Patient awake    Reviewed: Allergy & Precautions, NPO status , Patient's Chart, lab work & pertinent test results  History of Anesthesia Complications (+) PONV and history of anesthetic complications  Airway Mallampati: II  TM Distance: >3 FB Neck ROM: Full    Dental  (+) Dental Advisory Given   Pulmonary former smoker,  Quit smoking 1999, 50 pack year history    breath sounds clear to auscultation       Cardiovascular hypertension, Pt. on medications (-) angina+CHF (grade 1 diastolic dsyfunction)   Rhythm:Regular Rate:Normal  Echo 2021: 1. Calcified aortic valve with reduced cusp excursion but no AS by  doppler.  2. Left ventricular ejection fraction, by estimation, is 60 to 65%. The  left ventricle has normal function. The left ventricle has no regional  wall motion abnormalities. 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 Left  ventricular diastolic parameters are consistent with Grade I diastolic  dysfunction (impaired relaxation).  3. Right ventricular systolic function is normal. The right ventricular  size is normal.  4. The mitral valve is normal in structure. Trivial mitral valve  regurgitation. No evidence of mitral stenosis.  5. The aortic valve has an indeterminant number of cusps. Aortic valve  regurgitation is not visualized. No aortic stenosis is present.   Normal stress test 2021   Neuro/Psych PSYCHIATRIC DISORDERS Depression negative neurological ROS     GI/Hepatic negative GI ROS, Neg liver ROS,   Endo/Other  Hypothyroidism   Renal/GU negative Renal ROS  negative genitourinary   Musculoskeletal  (+) Arthritis , Osteoarthritis,    Abdominal   Peds  Hematology  (+) Blood dyscrasia, anemia , Pernicious anemia- follows w/ heme for injections    Anesthesia Other Findings L breast ca   Ozempic for weight loss  Reproductive/Obstetrics negative OB ROS                          Anesthesia Physical Anesthesia Plan  ASA: 2  Anesthesia Plan: General   Post-op Pain Management: Tylenol PO (pre-op)   Induction: Intravenous  PONV Risk Score and Plan: 4 or greater and Ondansetron, Dexamethasone and Treatment may vary due to age or medical condition  Airway Management Planned: LMA  Additional Equipment: None  Intra-op Plan:   Post-operative Plan:   Informed Consent: I have reviewed the patients History and Physical, chart, labs and discussed the procedure including the risks, benefits and alternatives for the proposed anesthesia with the patient or authorized representative who has indicated his/her understanding and acceptance.     Dental advisory given  Plan Discussed with: CRNA and Surgeon  Anesthesia Plan Comments:        Anesthesia Quick Evaluation

## 2021-10-18 NOTE — Op Note (Addendum)
10/18/2021  8:01 PM  PATIENT:  Meghan Hoffman  76 y.o. female  PRE-OPERATIVE DIAGNOSIS:  LEFT BREAST CANCER  POST-OPERATIVE DIAGNOSIS:  LEFT BREAST CANCER  PROCEDURE:  Procedure(s): LEFT BREAST LUMPECTOMY WITH RADIOACTIVE SEED LOCALIZATION (Left)  SURGEON:  Surgeon(s) and Role:    * Jovita Kussmaul, MD - Primary  PHYSICIAN ASSISTANT:   ASSISTANTS: Dr. Sheilah Pigeon   ANESTHESIA:   local and general  EBL:  10 mL   BLOOD ADMINISTERED:none  DRAINS: none   LOCAL MEDICATIONS USED:  MARCAINE     SPECIMEN:  Source of Specimen:  left breast tissue with additional deep margin  DISPOSITION OF SPECIMEN:  PATHOLOGY  COUNTS:  YES  TOURNIQUET:  * No tourniquets in log *  DICTATION: .Dragon Dictation  After informed consent was obtained the patient was brought to the operating room and placed in the supine position on the operating table.  After adequate induction of general anesthesia the patient's left chest, breast, and axillary area were prepped with ChloraPrep, allowed to dry, and draped in usual sterile manner.  An appropriate timeout was performed.  Previously an I-125 seed was placed in the upper outer quadrant of the left breast to mark an area of invasive breast cancer.  The neoprobe was set to I-125 in the area of radioactivity was readily identified far in the upper outer quadrant of the left breast.  Because of the position I elected to make an elliptical incision in the skin overlying the area of radioactivity with a 15 blade knife.  The incision was carried through the skin and subcutaneous tissue sharply with the electrocautery.  Dissection was then carried widely around the radioactive seed while checking the area of radioactivity frequently.  Once the specimen was removed it was oriented with the appropriate paint colors.  A specimen radiograph was obtained that showed the clip and seed to be near the center of the specimen.  I did elect to take an additional deep margin and  this was marked appropriately.  All of this tissue was then sent to pathology for further evaluation.  Hemostasis was achieved using the Bovie electrocautery.  The wound was irrigated with saline and infiltrated with more quarter percent Marcaine.  The cavity was marked with clips.  The deep layer of the incision was then closed with layers of interrupted 3-0 Vicryl stitches.  The skin was then closed with a running 4-0 Monocryl subcuticular stitch.  Dermabond dressings were applied.  The patient tolerated the procedure well.  At the end of the case all needle sponge and instrument counts were correct.  The patient was then awakened and taken recovery in stable condition. I was personally present during the key and critical portions of this procedure and immediately available throughout the entire procedure, as documented in my operative note.   PLAN OF CARE: Discharge to home after PACU  PATIENT DISPOSITION:  PACU - hemodynamically stable.   Delay start of Pharmacological VTE agent (>24hrs) due to surgical blood loss or risk of bleeding: not applicable

## 2021-10-18 NOTE — Transfer of Care (Signed)
Immediate Anesthesia Transfer of Care Note  Patient: Meghan Hoffman  Procedure(s) Performed: LEFT BREAST LUMPECTOMY WITH RADIOACTIVE SEED LOCALIZATION (Left: Breast)  Patient Location: PACU  Anesthesia Type:General  Level of Consciousness: awake, alert  and patient cooperative  Airway & Oxygen Therapy: Patient Spontanous Breathing and Patient connected to face mask oxygen  Post-op Assessment: Report given to RN and Post -op Vital signs reviewed and stable  Post vital signs: Reviewed and stable  Last Vitals:  Vitals Value Taken Time  BP 128/71 10/18/21 1447  Temp    Pulse 82 10/18/21 1449  Resp    SpO2 97 % 10/18/21 1449  Vitals shown include unvalidated device data.  Last Pain:  Vitals:   10/18/21 1319  TempSrc: Oral  PainSc: 0-No pain         Complications: No notable events documented.

## 2021-10-19 ENCOUNTER — Encounter (HOSPITAL_BASED_OUTPATIENT_CLINIC_OR_DEPARTMENT_OTHER): Payer: Self-pay | Admitting: General Surgery

## 2021-10-20 ENCOUNTER — Encounter: Payer: Self-pay | Admitting: *Deleted

## 2021-10-20 LAB — SURGICAL PATHOLOGY

## 2021-10-22 ENCOUNTER — Telehealth: Payer: Self-pay | Admitting: Genetic Counselor

## 2021-10-22 ENCOUNTER — Encounter: Payer: Self-pay | Admitting: Genetic Counselor

## 2021-10-22 ENCOUNTER — Ambulatory Visit: Payer: Self-pay | Admitting: Genetic Counselor

## 2021-10-22 DIAGNOSIS — Z170 Estrogen receptor positive status [ER+]: Secondary | ICD-10-CM

## 2021-10-22 DIAGNOSIS — Z1379 Encounter for other screening for genetic and chromosomal anomalies: Secondary | ICD-10-CM

## 2021-10-22 DIAGNOSIS — C50412 Malignant neoplasm of upper-outer quadrant of left female breast: Secondary | ICD-10-CM

## 2021-10-22 DIAGNOSIS — Z803 Family history of malignant neoplasm of breast: Secondary | ICD-10-CM

## 2021-10-22 NOTE — Progress Notes (Signed)
HPI:   Meghan Hoffman was previously seen in the [place] clinic due to a personal and family history of breast cancer and concerns regarding a hereditary predisposition to cancer. Please refer to our prior cancer genetics clinic note for more information regarding our discussion, assessment and recommendations, at the time. Meghan Hoffman recent genetic test results were disclosed to her, as were recommendations warranted by these results. These results and recommendations are discussed in more detail below.  CANCER HISTORY:  Oncology History  Malignant neoplasm of upper-outer quadrant of left breast in female, estrogen receptor positive ([place])  09/28/2021 Initial Diagnosis   Screening detected left breast masses.  On diagnostic mammogram only 1 persistent at 2 o'clock position 0.4 cm, axilla negative, ultrasound biopsy: Grade 2 IDC ER 100%, PR 100%, HER2 negative, Ki-67 10%   10/06/2021 Cancer Staging   Staging form: Breast, AJCC 8th Edition - Clinical stage from 10/06/2021: Stage IA (cT1a, cN0, cM0, G2, ER+, PR+, HER2-) - Signed by Nicholas Lose, MD on 10/06/2021 Stage prefix: Initial diagnosis Histologic grading system: 3 grade system    10/21/2021 Genetic Testing   Negative hereditary cancer genetic testing: no pathogenic variants detected in [facility] CancerNext-Expanded +RNAinsight Panel.  Variants of uncertain significance detected in MSH3 at  p.D185H (c.553G>C) and SDHA at p.P372R (c.1115C>G).  The report date is October 21, 2021.   The CancerNext-Expanded gene panel offered by [facility] and includes sequencing, rearrangement, and RNA analysis for the following 77 genes: AIP, ALK, APC, ATM, AXIN2, BAP1, BARD1, BLM, BMPR1A, BRCA1, BRCA2, BRIP1, CDC73, CDH1, CDK4, CDKN1B, CDKN2A, CHEK2, CTNNA1, DICER1, FANCC, FH, FLCN, GALNT12, KIF1B, LZTR1, MAX, MEN1, MET, MLH1, MSH2, MSH3, MSH6, MUTYH, NBN, NF1, NF2, NTHL1, PALB2, PHOX2B, PMS2, POT1, PRKAR1A, PTCH1, PTEN, RAD51C, RAD51D, RB1, RECQL,  RET, SDHA, SDHAF2, SDHB, SDHC, SDHD, SMAD4, SMARCA4, SMARCB1, SMARCE1, STK11, SUFU, TMEM127, TP53, TSC1, TSC2, VHL and XRCC2 (sequencing and deletion/duplication); EGFR, EGLN1, HOXB13, KIT, MITF, PDGFRA, POLD1, and POLE (sequencing only); EPCAM and GREM1 (deletion/duplication only).     FAMILY HISTORY:  We obtained a detailed, 4-generation family history.  Significant diagnoses are listed below:      Family History  Problem Relation Age of Onset   Breast cancer Maternal Aunt          dx 44s   Breast cancer Paternal Grandmother          dx 38s    Meghan Hoffman is unaware of previous family history of genetic testing for hereditary cancer risks. There is no reported Ashkenazi Jewish ancestry. There is no known consanguinity.  GENETIC TEST RESULTS:  The [facility] CancerNext-Expanded +RNAinsight Panel found no pathogenic mutations. The CancerNext-Expanded gene panel offered by [facility] and includes sequencing, rearrangement, and RNA analysis for the following 77 genes: AIP, ALK, APC, ATM, AXIN2, BAP1, BARD1, BLM, BMPR1A, BRCA1, BRCA2, BRIP1, CDC73, CDH1, CDK4, CDKN1B, CDKN2A, CHEK2, CTNNA1, DICER1, FANCC, FH, FLCN, GALNT12, KIF1B, LZTR1, MAX, MEN1, MET, MLH1, MSH2, MSH3, MSH6, MUTYH, NBN, NF1, NF2, NTHL1, PALB2, PHOX2B, PMS2, POT1, PRKAR1A, PTCH1, PTEN, RAD51C, RAD51D, RB1, RECQL, RET, SDHA, SDHAF2, SDHB, SDHC, SDHD, SMAD4, SMARCA4, SMARCB1, SMARCE1, STK11, SUFU, TMEM127, TP53, TSC1, TSC2, VHL and XRCC2 (sequencing and deletion/duplication); EGFR, EGLN1, HOXB13, KIT, MITF, PDGFRA, POLD1, and POLE (sequencing only); EPCAM and GREM1 (deletion/duplication only).    The test report has been scanned into EPIC and is located under the Molecular Pathology section of the Results Review tab.  A portion of the result report is included below for reference. Genetic testing reported  out on October 21, 2021.     Genetic testing identified a variant of uncertain significance (VUS) in the MSH3 gene at p.D185H  (c.553G>C) and the SDHA gene at p.P372R (c.1115C>G).  At this time, it is unknown if these variants are associated with an increased risk for cancer or if it is benign, but most uncertain variants are reclassified to benign. They should not be used to make medical management decisions. With time, we suspect the laboratory will determine the significance of this variant, if any. If the laboratory reclassifies these variants, we will attempt to contact Meghan Hoffman to discuss it further.   Even though a pathogenic variant was not identified, possible explanations for the cancer in the family may include: There may be no hereditary risk for cancer in the family. The cancers in Meghan Hoffman and/or her family may be sporadic/familial or due to other genetic and environmental factors. There may be a gene mutation in one of these genes that current testing methods cannot detect but that chance is small. There could be another gene that has not yet been discovered, or that we have not yet tested, that is responsible for the cancer diagnoses in the family.  It is also possible there is a hereditary cause for the cancer in the family that Meghan Hoffman did not inherit.   Therefore, it is important to remain in touch with cancer genetics in the future so that we can continue to offer Meghan Hoffman the most up to date genetic testing.    ADDITIONAL GENETIC TESTING:  We discussed with Meghan Hoffman that her genetic testing was fairly extensive.  If there are genes identified to increase cancer risk that can be analyzed in the future, we would be happy to discuss and coordinate this testing at that time.    CANCER SCREENING RECOMMENDATIONS:  Meghan Hoffman test result is considered negative (normal).  This means that we have not identified a hereditary cause for her personal history of breast cancer at this time.   An individual's cancer risk and medical management are not determined by genetic test results alone. Overall cancer  risk assessment incorporates additional factors, including personal medical history, family history, and any available genetic information that may result in a personalized plan for cancer prevention and surveillance. Therefore, it is recommended she continue to follow the cancer management and screening guidelines provided by her oncology and primary healthcare provider.  RECOMMENDATIONS FOR FAMILY MEMBERS:   Since she did not inherit a identifiable mutation in a cancer predisposition gene included on this panel, her children could not have inherited a known mutation from her in one of these genes. Individuals in this family might be at some increased risk of developing cancer, over the general population risk, due to the family history of cancer.  Individuals in the family should notify their providers of the family history of cancer. We recommend women in this family have a yearly mammogram beginning at age 62, or 55 years younger than the earliest onset of cancer, an annual clinical breast exam, and perform monthly breast self-exams.   We do not recommend familial testing for the MSH3 and SDHA variants of uncertain significance (VUS).  FOLLOW-UP:  Lastly, we discussed with Meghan Hoffman that cancer genetics is a rapidly advancing field and it is possible that new genetic tests will be appropriate for her and/or her family members in the future. We encouraged her to remain in contact with cancer genetics on an annual basis so we  can update her personal and family histories and let her know of advances in cancer genetics that may benefit this family.   Our contact number was provided. Meghan Hoffman questions were answered to her satisfaction, and she knows she is welcome to call us at anytime with additional questions or concerns.   Lamaj Metoyer M. Joette Catching, [place], [facility] Genetic Counselor [email or web address] (P) [phone]

## 2021-10-22 NOTE — Telephone Encounter (Signed)
Revealed negative genetic testing and uncertain results in MSH3 and SDHA.  Discussed that we do not know why she has breast cancer or why there is cancer in the family. It could be sporadic/familial, due to a different gene that we are not testing, or maybe our current technology may not be able to pick something up.  It will be important for her to keep in contact with genetics to keep up with whether additional testing may be needed.

## 2021-10-25 ENCOUNTER — Encounter: Payer: Self-pay | Admitting: *Deleted

## 2021-10-29 ENCOUNTER — Ambulatory Visit: Payer: Medicare Other | Admitting: Hematology and Oncology

## 2021-10-29 NOTE — Progress Notes (Signed)
Patient Care Team: Crist Infante, MD as PCP - General (Internal Medicine) Jovita Kussmaul, MD as Consulting Physician (General Surgery) Nicholas Lose, MD as Consulting Physician (Hematology and Oncology) Kyung Rudd, MD as Consulting Physician (Radiation Oncology) Mauro Kaufmann, RN as Oncology Nurse Navigator Rockwell Germany, RN as Oncology Nurse Navigator  DIAGNOSIS:    ICD-10-CM   1. Malignant neoplasm of upper-outer quadrant of left breast in female, estrogen receptor positive ([place])  C50.412    Z17.0       SUMMARY OF ONCOLOGIC HISTORY: Oncology History  Malignant neoplasm of upper-outer quadrant of left breast in female, estrogen receptor positive ([place])  09/28/2021 Initial Diagnosis   Screening detected left breast masses.  On diagnostic mammogram only 1 persistent at 2 o'clock position 0.4 cm, axilla negative, ultrasound biopsy: Grade 2 IDC ER 100%, PR 100%, HER2 negative, Ki-67 10%   10/06/2021 Cancer Staging   Staging form: Breast, AJCC 8th Edition - Clinical stage from 10/06/2021: Stage IA (cT1a, cN0, cM0, G2, ER+, PR+, HER2-) - Signed by Nicholas Lose, MD on 10/06/2021 Stage prefix: Initial diagnosis Histologic grading system: 3 grade system    10/18/2021 Surgery   Left lumpectomy: Biopsy changes, no residual cancer   10/21/2021 Genetic Testing   Negative hereditary cancer genetic testing: no pathogenic variants detected in [facility] CancerNext-Expanded +RNAinsight Panel.  Variants of uncertain significance detected in MSH3 at  p.D185H (c.553G>C) and SDHA at p.P372R (c.1115C>G).  The report date is October 21, 2021.   The CancerNext-Expanded gene panel offered by [facility] and includes sequencing, rearrangement, and RNA analysis for the following 77 genes: AIP, ALK, APC, ATM, AXIN2, BAP1, BARD1, BLM, BMPR1A, BRCA1, BRCA2, BRIP1, CDC73, CDH1, CDK4, CDKN1B, CDKN2A, CHEK2, CTNNA1, DICER1, FANCC, FH, FLCN, GALNT12, KIF1B, LZTR1, MAX, MEN1, MET, MLH1, MSH2, MSH3, MSH6, MUTYH, NBN,  NF1, NF2, NTHL1, PALB2, PHOX2B, PMS2, POT1, PRKAR1A, PTCH1, PTEN, RAD51C, RAD51D, RB1, RECQL, RET, SDHA, SDHAF2, SDHB, SDHC, SDHD, SMAD4, SMARCA4, SMARCB1, SMARCE1, STK11, SUFU, TMEM127, TP53, TSC1, TSC2, VHL and XRCC2 (sequencing and deletion/duplication); EGFR, EGLN1, HOXB13, KIT, MITF, PDGFRA, POLD1, and POLE (sequencing only); EPCAM and GREM1 (deletion/duplication only).      CHIEF COMPLIANT: Follow-up of left breast cancer  INTERVAL HISTORY: Meghan Hoffman is a 76 y.o. with above-mentioned history of left breast cancer. Left lumpectomy on 10/18/2021 showed breast parenchyma with biopsy-related changes and was negative for residual  carcinoma. She presents to the clinic today for follow-up.  She has done very well from surgery standpoint without any pain or discomfort at this time.  She is happy that the compression has been taken off and she can breathe better.  ALLERGIES:  has No Known Allergies.  MEDICATIONS:  Current Outpatient Medications  Medication Sig Dispense Refill   aspirin EC 81 MG tablet Take 81 mg by mouth every other day. Swallow whole.     atorvastatin (LIPITOR) 20 MG tablet Take 40 mg by mouth daily. 40 MG TOTAL DAILY     B Complex Vitamins (VITAMIN B COMPLEX IJ) Inject as directed every 14 (fourteen) days.      cholecalciferol (VITAMIN D) 1000 UNITS tablet Take 1,000 Units by mouth every evening.     escitalopram (LEXAPRO) 10 MG tablet Take 10 mg by mouth daily.   11   fexofenadine (ALLEGRA) 180 MG tablet Take 180 mg by mouth every evening.     fluticasone (FLONASE) 50 MCG/ACT nasal spray Place 1 spray into both nostrils daily.     hydrochlorothiazide (HYDRODIURIL) 25 MG tablet  Take 12.5 mg by mouth every morning.     levothyroxine (SYNTHROID, LEVOTHROID) 125 MCG tablet Take 125 mcg by mouth daily before breakfast.   3   magnesium gluconate (MAGONATE) 500 MG tablet Take 500 mg by mouth daily.     oxyCODONE (ROXICODONE) 5 MG immediate release tablet Take 1 tablet (5 mg  total) by mouth every 6 (six) hours as needed for severe pain. 10 tablet 0   OZEMPIC, 1 MG/DOSE, 4 MG/3ML SOPN Inject into the skin.     potassium chloride (K-DUR,KLOR-CON) 10 MEQ tablet Take 10 mEq by mouth daily.     TURMERIC PO Take 1 tablet by mouth 2 (two) times daily.     No current facility-administered medications for this visit.    PHYSICAL EXAMINATION: ECOG PERFORMANCE STATUS: 1 - Symptomatic but completely ambulatory  Vitals:   11/01/21 1025  BP: (!) 150/69  Pulse: 83  Resp: 18  Temp: (!) 97.3 F (36.3 C)  SpO2: 99%   Filed Weights   11/01/21 1025  Weight: 174 lb (78.9 kg)      LABORATORY DATA:  I have reviewed the data as listed CMP Latest Ref Rng & Units 10/06/2021 05/14/2008  Glucose 70 - 99 mg/dL 82 99  BUN 8 - 23 mg/dL 18 16  Creatinine 0.44 - 1.00 mg/dL 0.63 0.64  Sodium 135 - 145 mmol/L 138 139  Potassium 3.5 - 5.1 mmol/L 3.7 3.3(L)  Chloride 98 - 111 mmol/L 100 102  CO2 22 - 32 mmol/L 31 31  Calcium 8.9 - 10.3 mg/dL 9.9 9.0  Total Protein 6.5 - 8.1 g/dL 7.5 6.4  Total Bilirubin 0.3 - 1.2 mg/dL 0.7 1.0  Alkaline Phos 38 - 126 U/L 72 46  AST 15 - 41 U/L 19 29  ALT 0 - 44 U/L 13 23    Lab Results  Component Value Date   WBC 5.3 10/06/2021   HGB 14.1 10/06/2021   HCT 42.2 10/06/2021   MCV 94.6 10/06/2021   PLT 219 10/06/2021   NEUTROABS 2.9 10/06/2021    ASSESSMENT & PLAN:  Malignant neoplasm of upper-outer quadrant of left breast in female, estrogen receptor positive ([place]) 09/28/2021:Screening detected left breast masses.  On diagnostic mammogram only 1 persistent at 2 o'clock position 0.4 cm, axilla negative, ultrasound biopsy: Grade 2 IDC ER 100%, PR 100%, HER2 negative, Ki-67 10%  10/21/2021: Left lumpectomy: Biopsy changes and no residual cancer   Pathology counseling: I discussed the final pathology report of the patient provided  a copy of this report. I discussed the margins.  We also discussed the final staging along with previously  performed ER/PR and HEr 2 testing.  Treatment plan: 1.  Patient decided not to do radiation.   2. Adjuvant antiestrogen therapy with letrozole 2.5 mg daily x5 years  Patient is not interested in doing radiation and therefore she wants [place] to cancel her appointment with Dr. Lisbeth Renshaw.  I sent him a message.  Letrozole counseling: We discussed the risks and benefits of anti-estrogen therapy with aromatase inhibitors. These include but not limited to insomnia, hot flashes, mood changes, vaginal dryness, bone density loss, and weight gain. We strongly believe that the benefits far outweigh the risks. Patient understands these risks and consented to starting treatment. Planned treatment duration is 5 years.  Return to clinic in 3 months for survivorship care plan visit     No orders of the defined types were placed in this encounter.  The patient has a good understanding of  the overall plan. she agrees with it. she will call with any problems that may develop before the next visit here.  Total time spent: 20 mins including face to face time and time spent for planning, charting and coordination of care  Rulon Eisenmenger, MD, MPH 11/01/2021  I, Thana Ates, am acting as scribe for Dr. Nicholas Lose.  I have reviewed the above documentation for accuracy and completeness, and I agree with the above.

## 2021-11-01 ENCOUNTER — Encounter: Payer: Self-pay | Admitting: *Deleted

## 2021-11-01 ENCOUNTER — Other Ambulatory Visit: Payer: Self-pay

## 2021-11-01 ENCOUNTER — Inpatient Hospital Stay (HOSPITAL_BASED_OUTPATIENT_CLINIC_OR_DEPARTMENT_OTHER): Payer: Medicare Other | Admitting: Hematology and Oncology

## 2021-11-01 DIAGNOSIS — Z170 Estrogen receptor positive status [ER+]: Secondary | ICD-10-CM

## 2021-11-01 DIAGNOSIS — C50412 Malignant neoplasm of upper-outer quadrant of left female breast: Secondary | ICD-10-CM

## 2021-11-01 MED ORDER — LETROZOLE 2.5 MG PO TABS: 2.5000 mg | ORAL_TABLET | Freq: Every day | ORAL | 3 refills | Status: DC

## 2021-11-01 NOTE — Assessment & Plan Note (Signed)
09/28/2021:Screening detected left breast masses.  On diagnostic mammogram only 1 persistent at 2 o'clock position 0.4 cm, axilla negative, ultrasound biopsy: Grade 2 IDC ER 100%, PR 100%, HER2 negative, Ki-67 10%  10/21/2021: Left lumpectomy: Biopsy changes and no residual cancer   Pathology counseling: I discussed the final pathology report of the patient provided  a copy of this report. I discussed the margins.  We also discussed the final staging along with previously performed ER/PR and HEr 2 testing.  Treatment plan: 1.  +/- Adjuvant radiation therapy followed by 2. Adjuvant antiestrogen therapy  Tumor board discussion to determine if she would benefit from radiation. Return to clinic to start antiestrogen therapy.

## 2021-11-04 ENCOUNTER — Ambulatory Visit: Payer: Medicare Other | Admitting: Radiation Oncology

## 2021-11-04 ENCOUNTER — Ambulatory Visit: Payer: Medicare Other

## 2021-11-05 ENCOUNTER — Ambulatory Visit: Payer: Medicare Other | Admitting: Radiation Oncology

## 2021-11-12 ENCOUNTER — Encounter: Payer: Self-pay | Admitting: *Deleted

## 2021-11-18 ENCOUNTER — Encounter (HOSPITAL_COMMUNITY): Payer: Self-pay

## 2022-02-01 ENCOUNTER — Telehealth: Payer: Self-pay | Admitting: Adult Health

## 2022-02-01 ENCOUNTER — Other Ambulatory Visit: Payer: Self-pay | Admitting: *Deleted

## 2022-02-01 ENCOUNTER — Inpatient Hospital Stay: Payer: Medicare Other | Attending: Adult Health | Admitting: Adult Health

## 2022-02-01 ENCOUNTER — Encounter: Payer: Self-pay | Admitting: Adult Health

## 2022-02-01 ENCOUNTER — Other Ambulatory Visit: Payer: Self-pay

## 2022-02-01 VITALS — BP 130/78 | HR 78 | Temp 97.9°F | Resp 16 | Ht 65.0 in | Wt 172.3 lb

## 2022-02-01 DIAGNOSIS — Z923 Personal history of irradiation: Secondary | ICD-10-CM | POA: Diagnosis not present

## 2022-02-01 DIAGNOSIS — Z170 Estrogen receptor positive status [ER+]: Secondary | ICD-10-CM | POA: Diagnosis not present

## 2022-02-01 DIAGNOSIS — E2839 Other primary ovarian failure: Secondary | ICD-10-CM

## 2022-02-01 DIAGNOSIS — C50412 Malignant neoplasm of upper-outer quadrant of left female breast: Secondary | ICD-10-CM | POA: Diagnosis present

## 2022-02-01 DIAGNOSIS — M85852 Other specified disorders of bone density and structure, left thigh: Secondary | ICD-10-CM | POA: Diagnosis not present

## 2022-02-01 DIAGNOSIS — Z79811 Long term (current) use of aromatase inhibitors: Secondary | ICD-10-CM | POA: Diagnosis not present

## 2022-02-01 NOTE — Progress Notes (Signed)
Survivorship Referral

## 2022-02-01 NOTE — Telephone Encounter (Signed)
Scheduled appointment per 5/30 los. Patient is aware.

## 2022-02-01 NOTE — Progress Notes (Signed)
SURVIVORSHIP VISIT:   BRIEF ONCOLOGIC HISTORY:  Oncology History  Malignant neoplasm of upper-outer quadrant of left breast in female, estrogen receptor positive ([place])  09/28/2021 Initial Diagnosis   Screening detected left breast masses.  On diagnostic mammogram only 1 persistent at 2 o'clock position 0.4 cm, axilla negative, ultrasound biopsy: Grade 2 IDC ER 100%, PR 100%, HER2 negative, Ki-67 10%   10/06/2021 Cancer Staging   Staging form: Breast, AJCC 8th Edition - Clinical stage from 10/06/2021: Stage IA (cT1a, cN0, cM0, G2, ER+, PR+, HER2-) - Signed by Nicholas Lose, MD on 10/06/2021 Stage prefix: Initial diagnosis Histologic grading system: 3 grade system    10/18/2021 Surgery   Left lumpectomy: Biopsy changes, no residual cancer   10/21/2021 Genetic Testing   Negative hereditary cancer genetic testing: no pathogenic variants detected in [facility] CancerNext-Expanded +RNAinsight Panel.  Variants of uncertain significance detected in MSH3 at  p.D185H (c.553G>C) and SDHA at p.P372R (c.1115C>G).  The report date is October 21, 2021.   The CancerNext-Expanded gene panel offered by [facility] and includes sequencing, rearrangement, and RNA analysis for the following 77 genes: AIP, ALK, APC, ATM, AXIN2, BAP1, BARD1, BLM, BMPR1A, BRCA1, BRCA2, BRIP1, CDC73, CDH1, CDK4, CDKN1B, CDKN2A, CHEK2, CTNNA1, DICER1, FANCC, FH, FLCN, GALNT12, KIF1B, LZTR1, MAX, MEN1, MET, MLH1, MSH2, MSH3, MSH6, MUTYH, NBN, NF1, NF2, NTHL1, PALB2, PHOX2B, PMS2, POT1, PRKAR1A, PTCH1, PTEN, RAD51C, RAD51D, RB1, RECQL, RET, SDHA, SDHAF2, SDHB, SDHC, SDHD, SMAD4, SMARCA4, SMARCB1, SMARCE1, STK11, SUFU, TMEM127, TP53, TSC1, TSC2, VHL and XRCC2 (sequencing and deletion/duplication); EGFR, EGLN1, HOXB13, KIT, MITF, PDGFRA, POLD1, and POLE (sequencing only); EPCAM and GREM1 (deletion/duplication only).      INTERVAL HISTORY:  Meghan Hoffman to review her survivorship care plan detailing her treatment course for breast cancer, as well as  monitoring long-term side effects of that treatment, education regarding health maintenance, screening, and overall wellness and health promotion.     Overall, Meghan Hoffman reports feeling quite well.  She is taking letrozole daily with good tolerance.  She denies any hot flashes, vaginal dryness, or arthralgias.  Her most recent bone density was completed at the breast center.  REVIEW OF SYSTEMS:  Review of Systems  Constitutional:  Negative for appetite change, chills, fatigue, fever and unexpected weight change.  HENT:   Negative for hearing loss, lump/mass and trouble swallowing.   Eyes:  Negative for eye problems and icterus.  Respiratory:  Negative for chest tightness, cough and shortness of breath.   Cardiovascular:  Negative for chest pain, leg swelling and palpitations.  Gastrointestinal:  Negative for abdominal distention, abdominal pain, constipation, diarrhea, nausea and vomiting.  Endocrine: Negative for hot flashes.  Genitourinary:  Negative for difficulty urinating.   Musculoskeletal:  Negative for arthralgias.  Skin:  Negative for itching and rash.  Neurological:  Negative for dizziness, extremity weakness, headaches and numbness.  Hematological:  Negative for adenopathy. Does not bruise/bleed easily.  Psychiatric/Behavioral:  Negative for depression. The patient is not nervous/anxious.   Breast: Denies any new nodularity, masses, tenderness, nipple changes, or nipple discharge.      ONCOLOGY TREATMENT TEAM:  1. Surgeon:  Dr. Marlou Starks at [facility] 2. Medical Oncologist: Dr. Payton Mccallum 3. Radiation Oncologist: Dr. Lisbeth Renshaw    PAST MEDICAL/SURGICAL HISTORY:  Past Medical History:  Diagnosis Date   Anemia    "Pernicious Anemia"- injections every 3 weeks -next 11-25-14   Arthritis    Breast cancer [facility])    Coronary artery disease    Depression    Family history  of breast cancer 10/07/2021   H/O seasonal allergies    Hyperlipidemia    Hypertension    Hypothyroidism     PONV (postoperative nausea and vomiting)    once under general anesthesia   Past Surgical History:  Procedure Laterality Date   ABDOMINAL HYSTERECTOMY     BREAST LUMPECTOMY WITH RADIOACTIVE SEED LOCALIZATION Left 10/18/2021   Procedure: LEFT BREAST LUMPECTOMY WITH RADIOACTIVE SEED LOCALIZATION;  Surgeon: Jovita Kussmaul, MD;  Location: [place];  Service: General;  Laterality: Left;   COLONOSCOPY WITH PROPOFOL N/A 11/24/2014   Procedure: COLONOSCOPY WITH PROPOFOL;  Surgeon: Garlan Fair, MD;  Location: [facility];  Service: Endoscopy;  Laterality: N/A;   KNEE SURGERY Left    open age 51   LAPAROSCOPY     x2 ovarian cyst   ORIF WRIST FRACTURE Right    TONSILLECTOMY     age 29     ALLERGIES:  No Known Allergies   CURRENT MEDICATIONS:  Outpatient Encounter Medications as of 02/01/2022  Medication Sig Note   aspirin EC 81 MG tablet Take 81 mg by mouth every other day. Swallow whole.    atorvastatin (LIPITOR) 20 MG tablet Take 40 mg by mouth daily. 40 MG TOTAL DAILY    B Complex Vitamins (VITAMIN B COMPLEX IJ) Inject as directed every 14 (fourteen) days.  11/13/2014: Next dose due 11/25/2014   cholecalciferol (VITAMIN D) 1000 UNITS tablet Take 1,000 Units by mouth every evening.    escitalopram (LEXAPRO) 10 MG tablet Take 10 mg by mouth daily.  11/05/2014: -   fexofenadine (ALLEGRA) 180 MG tablet Take 180 mg by mouth every evening.    fluticasone (FLONASE) 50 MCG/ACT nasal spray Place 1 spray into both nostrils daily.    hydrochlorothiazide (HYDRODIURIL) 25 MG tablet Take 12.5 mg by mouth every morning.    letrozole (FEMARA) 2.5 MG tablet Take 1 tablet (2.5 mg total) by mouth daily.    levothyroxine (SYNTHROID, LEVOTHROID) 125 MCG tablet Take 125 mcg by mouth daily before breakfast.  11/05/2014: -   magnesium gluconate (MAGONATE) 500 MG tablet Take 500 mg by mouth daily.    oxyCODONE (ROXICODONE) 5 MG immediate release tablet Take 1 tablet (5 mg total) by mouth every  6 (six) hours as needed for severe pain.    OZEMPIC, 1 MG/DOSE, 4 MG/3ML SOPN Inject into the skin.    potassium chloride (K-DUR,KLOR-CON) 10 MEQ tablet Take 10 mEq by mouth daily.    TURMERIC PO Take 1 tablet by mouth 2 (two) times daily.    No facility-administered encounter medications on file as of 02/01/2022.     ONCOLOGIC FAMILY HISTORY:  Family History  Problem Relation Age of Onset   Heart disease Mother    Heart disease Father 76   Breast cancer Maternal Aunt        dx 17s   Breast cancer Paternal Grandmother        dx 49s     GENETIC COUNSELING/TESTING: Negative  SOCIAL HISTORY:  Social History   Socioeconomic History   Marital status: Married    Spouse name: Not on file   Number of children: 2   Years of education: Not on file   Highest education level: Not on file  Occupational History   Occupation: retired  Tobacco Use   Smoking status: Former    Packs/day: 2.00    Years: 25.00    Pack years: 50.00    Types: Cigarettes    Quit date: 11/13/1997  Years since quitting: 24.2   Smokeless tobacco: Never  Vaping Use   Vaping Use: Never used  Substance and Sexual Activity   Alcohol use: Yes    Comment: 3 glasses wine daily   Drug use: No   Sexual activity: Not on file  Other Topics Concern   Not on file  Social History Narrative   Not on file   Social Determinants of Health   Financial Resource Strain: Not on file  Food Insecurity: Not on file  Transportation Needs: Not on file  Physical Activity: Not on file  Stress: Not on file  Social Connections: Not on file  Intimate Partner Violence: Not on file     OBSERVATIONS/OBJECTIVE:  BP (!) 154/81 (BP Location: Left Arm, Patient Position: Sitting)   Pulse 78   Temp 97.9 F (36.6 C) (Temporal)   Resp 16   Ht 5' 5"  (1.651 m)   Wt 172 lb 4.8 oz (78.2 kg)   SpO2 100%   BMI 28.67 kg/m  GENERAL: Patient is a well appearing female in no acute distress HEENT:  Sclerae anicteric.  Oropharynx  clear and moist. No ulcerations or evidence of oropharyngeal candidiasis. Neck is supple.  NODES:  No cervical, supraclavicular, or axillary lymphadenopathy palpated.  BREAST EXAM: Left breast status postlumpectomy no sign of local recurrence right breast is benign. LUNGS:  Clear to auscultation bilaterally.  No wheezes or rhonchi. HEART:  Regular rate and rhythm. No murmur appreciated. ABDOMEN:  Soft, nontender.  Positive, normoactive bowel sounds. No organomegaly palpated. MSK:  No focal spinal tenderness to palpation. Full range of motion bilaterally in the upper extremities. EXTREMITIES:  No peripheral edema.   SKIN:  Clear with no obvious rashes or skin changes. No nail dyscrasia. NEURO:  Nonfocal. Well oriented.  Appropriate affect.  LABORATORY DATA:  None for this visit.  DIAGNOSTIC IMAGING:  None for this visit.      ASSESSMENT AND PLAN:  Ms.. Hoffman is a pleasant 76 y.o. female with Stage IA left breast invasive ductal carcinoma, ER+/PR+/HER2-, diagnosed in 09/2021, treated with lumpectomy, and anti-estrogen therapy with Letrozole beginning in 11/2021.  She presents to the Survivorship Clinic for our initial meeting and routine follow-up post-completion of treatment for breast cancer.    1. Stage IA left breast cancer:  Meghan Hoffman is continuing to recover from definitive treatment for breast cancer. She will follow-up with her medical oncologist, Dr. Lindi Adie in 6 months with history and physical exam per surveillance protocol.  She will continue her anti-estrogen therapy with letrozole. Thus far, she is tolerating the letrozole well, with minimal side effects. She was instructed to make Dr. Lindi Adie or myself aware if she begins to experience any worsening side effects of the medication and I could see her back in clinic to help manage those side effects, as needed. Her mammogram is due December 2023; orders placed today.   Today, a comprehensive survivorship care plan and treatment  summary was reviewed with the patient today detailing her breast cancer diagnosis, treatment course, potential late/long-term effects of treatment, appropriate follow-up care with recommendations for the future, and patient education resources.  A copy of this summary, along with a letter will be sent to the patient's primary care provider via mail/fax/In Basket message after today's visit.    2. Bone health:  Given Meghan Hoffman age/history of breast cancer and her current treatment regimen including anti-estrogen therapy with letrozole, she is at risk for bone demineralization.  Her last DEXA scan was November  2021, which showed osteopenia with a T score of -1.7 in the left femur.  This will be due to for repeat in November 2023.  She was given education on specific activities to promote bone health.  3. Cancer screening:  Due to Meghan Hoffman history and her age, she should receive screening for skin cancers, colon cancer, and gynecologic cancers.  The information and recommendations are listed on the patient's comprehensive care plan/treatment summary and were reviewed in detail with the patient.    4. Health maintenance and wellness promotion: Meghan Hoffman was encouraged to consume 5-7 servings of fruits and vegetables per day. We reviewed the "Nutrition Rainbow" handout.  She was also encouraged to engage in moderate to vigorous exercise for 30 minutes per day most days of the week. We discussed the [employer] fitness program, which is designed for cancer survivors to help them become more physically fit after cancer treatments.  She was instructed to limit her alcohol consumption and continue to abstain from tobacco use.     5. Support services/counseling: It is not uncommon for this period of the patient's cancer care trajectory to be one of many emotions and stressors. She was given information regarding our available services and encouraged to contact me with any questions or for help enrolling in  any of our support group/programs.    Follow up instructions:    -Return to cancer center in 6 months for follow-up with Dr. Lindi Adie -Mammogram due in December 2023 -Bone density due November 2023 -Follow up with surgery in 1 year -She is welcome to return back to the Survivorship Clinic at any time; no additional follow-up needed at this time.  -Consider referral back to survivorship as a long-term survivor for continued surveillance  The patient was provided an opportunity to ask questions and all were answered. The patient agreed with the plan and demonstrated an understanding of the instructions.   Total encounter time:30 minutes*in face-to-face visit time, chart review, lab review, care coordination, order entry, and documentation of the encounter time.    Meghan Bihari, NP 02/01/22 10:44 AM Medical Oncology and Hematology [facility] [place], [place] [address] Tel. [phone]    Fax. [phone]  *Total Encounter Time as defined by the Centers for Medicare and Medicaid Services includes, in addition to the face-to-face time of a patient visit (documented in the note above) non-face-to-face time: obtaining and reviewing outside history, ordering and reviewing medications, tests or procedures, care coordination (communications with other health care professionals or caregivers) and documentation in the medical record.

## 2022-04-15 ENCOUNTER — Other Ambulatory Visit: Payer: Self-pay | Admitting: Hematology and Oncology

## 2022-08-02 NOTE — Progress Notes (Signed)
Patient Care Team: Crist Infante, MD as PCP - General (Internal Medicine) Jovita Kussmaul, MD as Consulting Physician (General Surgery) Nicholas Lose, MD as Consulting Physician (Hematology and Oncology) Kyung Rudd, MD as Consulting Physician (Radiation Oncology)  DIAGNOSIS: No diagnosis found.  SUMMARY OF ONCOLOGIC HISTORY: Oncology History  Malignant neoplasm of upper-outer quadrant of left breast in female, estrogen receptor positive ([place])  09/28/2021 Initial Diagnosis   Screening detected left breast masses.  On diagnostic mammogram only 1 persistent at 2 o'clock position 0.4 cm, axilla negative, ultrasound biopsy: Grade 2 IDC ER 100%, PR 100%, HER2 negative, Ki-67 10%   10/06/2021 Cancer Staging   Staging form: Breast, AJCC 8th Edition - Clinical stage from 10/06/2021: Stage IA (cT1a, cN0, cM0, G2, ER+, PR+, HER2-) - Signed by Nicholas Lose, MD on 10/06/2021 Stage prefix: Initial diagnosis Histologic grading system: 3 grade system   10/18/2021 Surgery   Left lumpectomy: Biopsy changes, no residual cancer   10/21/2021 Genetic Testing   Negative hereditary cancer genetic testing: no pathogenic variants detected in [facility] CancerNext-Expanded +RNAinsight Panel.  Variants of uncertain significance detected in MSH3 at  p.D185H (c.553G>C) and SDHA at p.P372R (c.1115C>G).  The report date is October 21, 2021.   The CancerNext-Expanded gene panel offered by [facility] and includes sequencing, rearrangement, and RNA analysis for the following 77 genes: AIP, ALK, APC, ATM, AXIN2, BAP1, BARD1, BLM, BMPR1A, BRCA1, BRCA2, BRIP1, CDC73, CDH1, CDK4, CDKN1B, CDKN2A, CHEK2, CTNNA1, DICER1, FANCC, FH, FLCN, GALNT12, KIF1B, LZTR1, MAX, MEN1, MET, MLH1, MSH2, MSH3, MSH6, MUTYH, NBN, NF1, NF2, NTHL1, PALB2, PHOX2B, PMS2, POT1, PRKAR1A, PTCH1, PTEN, RAD51C, RAD51D, RB1, RECQL, RET, SDHA, SDHAF2, SDHB, SDHC, SDHD, SMAD4, SMARCA4, SMARCB1, SMARCE1, STK11, SUFU, TMEM127, TP53, TSC1, TSC2, VHL and XRCC2  (sequencing and deletion/duplication); EGFR, EGLN1, HOXB13, KIT, MITF, PDGFRA, POLD1, and POLE (sequencing only); EPCAM and GREM1 (deletion/duplication only).    11/2021 -  Anti-estrogen oral therapy   Letrozole daily     CHIEF COMPLIANT: Follow-up of left breast cancer    INTERVAL HISTORY: Meghan Hoffman is a 76 y.o. with above-mentioned history of left breast cancer. Left lumpectomy on 10/18/2021 showed breast parenchyma with biopsy-related changes and was negative for residual  carcinoma. She presents to the clinic today for follow-up.     ALLERGIES:  has No Known Allergies.  MEDICATIONS:  Current Outpatient Medications  Medication Sig Dispense Refill   aspirin EC 81 MG tablet Take 81 mg by mouth every other day. Swallow whole.     atorvastatin (LIPITOR) 20 MG tablet Take 40 mg by mouth daily. 40 MG TOTAL DAILY     B Complex Vitamins (VITAMIN B COMPLEX IJ) Inject as directed every 14 (fourteen) days.      cholecalciferol (VITAMIN D) 1000 UNITS tablet Take 1,000 Units by mouth every evening.     escitalopram (LEXAPRO) 10 MG tablet Take 10 mg by mouth daily.   11   fexofenadine (ALLEGRA) 180 MG tablet Take 180 mg by mouth every evening.     fluticasone (FLONASE) 50 MCG/ACT nasal spray Place 1 spray into both nostrils daily.     hydrochlorothiazide (HYDRODIURIL) 25 MG tablet Take 12.5 mg by mouth every morning.     letrozole (FEMARA) 2.5 MG tablet TAKE 1 TABLET BY MOUTH DAILY 90 tablet 2   levothyroxine (SYNTHROID, LEVOTHROID) 125 MCG tablet Take 125 mcg by mouth daily before breakfast.   3   magnesium gluconate (MAGONATE) 500 MG tablet Take 500 mg by mouth daily.  OZEMPIC, 1 MG/DOSE, 4 MG/3ML SOPN Inject into the skin.     potassium chloride (K-DUR,KLOR-CON) 10 MEQ tablet Take 10 mEq by mouth daily.     TURMERIC PO Take 1 tablet by mouth 2 (two) times daily.     No current facility-administered medications for this visit.    PHYSICAL EXAMINATION: ECOG PERFORMANCE STATUS:  {CHL ONC ECOG PS:[id number]}  There were no vitals filed for this visit. There were no vitals filed for this visit.  BREAST:*** No palpable masses or nodules in either right or left breasts. No palpable axillary supraclavicular or infraclavicular adenopathy no breast tenderness or nipple discharge. (exam performed in the presence of a chaperone)  LABORATORY DATA:  I have reviewed the data as listed    Latest Ref Rng & Units 10/06/2021   12:19 PM 05/14/2008    1:48 PM  CMP  Glucose 70 - 99 mg/dL 82  99   BUN 8 - 23 mg/dL 18  16   Creatinine 0.44 - 1.00 mg/dL 0.63  0.64   Sodium 135 - 145 mmol/L 138  139   Potassium 3.5 - 5.1 mmol/L 3.7  3.3   Chloride 98 - 111 mmol/L 100  102   CO2 22 - 32 mmol/L 31  31   Calcium 8.9 - 10.3 mg/dL 9.9  9.0   Total Protein 6.5 - 8.1 g/dL 7.5  6.4   Total Bilirubin 0.3 - 1.2 mg/dL 0.7  1.0   Alkaline Phos 38 - 126 U/L 72  46   AST 15 - 41 U/L 19  29   ALT 0 - 44 U/L 13  23     Lab Results  Component Value Date   WBC 5.3 10/06/2021   HGB 14.1 10/06/2021   HCT 42.2 10/06/2021   MCV 94.6 10/06/2021   PLT 219 10/06/2021   NEUTROABS 2.9 10/06/2021    ASSESSMENT & PLAN:  No problem-specific Assessment & Plan notes found for this encounter.    No orders of the defined types were placed in this encounter.  The patient has a good understanding of the overall plan. she agrees with it. she will call with any problems that may develop before the next visit here. Total time spent: 30 mins including face to face time and time spent for planning, charting and co-ordination of care   Suzzette Righter, [place] 08/02/22    I Gardiner Coins am scribing for Dr. Lindi Adie  ***

## 2022-08-04 ENCOUNTER — Inpatient Hospital Stay: Payer: Medicare Other | Attending: Hematology and Oncology | Admitting: Hematology and Oncology

## 2022-08-04 VITALS — BP 150/80 | HR 71 | Temp 97.3°F | Resp 18 | Ht 65.0 in | Wt 186.1 lb

## 2022-08-04 DIAGNOSIS — Z170 Estrogen receptor positive status [ER+]: Secondary | ICD-10-CM | POA: Insufficient documentation

## 2022-08-04 DIAGNOSIS — Z79811 Long term (current) use of aromatase inhibitors: Secondary | ICD-10-CM | POA: Diagnosis not present

## 2022-08-04 DIAGNOSIS — C50412 Malignant neoplasm of upper-outer quadrant of left female breast: Secondary | ICD-10-CM | POA: Insufficient documentation

## 2022-08-04 DIAGNOSIS — Z79899 Other long term (current) drug therapy: Secondary | ICD-10-CM | POA: Diagnosis not present

## 2022-08-04 NOTE — Assessment & Plan Note (Addendum)
09/28/2021:Screening detected left breast masses.  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  Patient decided not to do radiation.   2. Adjuvant antiestrogen therapy with letrozole 2.5 mg daily x5 years started 11/01/2021   Letrozole toxicities: Denies any hot flashes.  Mild joint stiffness.  Breast cancer surveillance: Breast exam 08/04/2022: Benign Mammogram and bone density scheduled for 08/17/2022  Return to clinic in 1 year for follow-up

## 2022-08-17 ENCOUNTER — Ambulatory Visit
Admission: RE | Admit: 2022-08-17 | Discharge: 2022-08-17 | Disposition: A | Discharge status: Home / Self Care | Payer: Medicare Other | Source: Ambulatory Visit | Attending: Adult Health | Admitting: Adult Health

## 2022-08-17 DIAGNOSIS — E2839 Other primary ovarian failure: Secondary | ICD-10-CM

## 2022-08-17 DIAGNOSIS — Z170 Estrogen receptor positive status [ER+]: Secondary | ICD-10-CM

## 2022-08-18 ENCOUNTER — Telehealth: Payer: Self-pay | Admitting: *Deleted

## 2022-08-18 NOTE — Telephone Encounter (Signed)
-----   Message from Gardenia Phlegm, NP sent at 08/18/2022  8:43 AM EST ----- Mild osteopenia, please notify patient.  Will repeat in 2 years.  She should continue with calcium, vitamin d, and weight bearing exercises.   ----- Message ----- From: Interface, Rad Results In Sent: 08/17/2022   3:09 PM EST To: Gardenia Phlegm, NP

## 2022-08-18 NOTE — Telephone Encounter (Signed)
Per Annabelle Harman, called pt with message below. Pt verbalized understanding.

## 2022-10-25 ENCOUNTER — Other Ambulatory Visit: Payer: Self-pay | Admitting: Hematology and Oncology

## 2022-11-03 ENCOUNTER — Ambulatory Visit: Payer: Medicare Other | Attending: Nurse Practitioner | Admitting: Nurse Practitioner

## 2022-11-03 ENCOUNTER — Encounter: Payer: Self-pay | Admitting: Nurse Practitioner

## 2022-11-03 VITALS — BP 142/82 | HR 70

## 2022-11-03 DIAGNOSIS — I10 Essential (primary) hypertension: Secondary | ICD-10-CM | POA: Diagnosis not present

## 2022-11-03 DIAGNOSIS — E785 Hyperlipidemia, unspecified: Secondary | ICD-10-CM | POA: Diagnosis not present

## 2022-11-03 DIAGNOSIS — I358 Other nonrheumatic aortic valve disorders: Secondary | ICD-10-CM | POA: Diagnosis not present

## 2022-11-03 DIAGNOSIS — I2510 Atherosclerotic heart disease of native coronary artery without angina pectoris: Secondary | ICD-10-CM

## 2022-11-03 NOTE — Patient Instructions (Addendum)
Medication Instructions:  Increase Lisinopril 10 mg daily  *If you need a refill on your cardiac medications before your next appointment, please call your pharmacy*   Lab Work: NONE ordered at this time of appointment   If you have labs (blood work) drawn today and your tests are completely normal, you will receive your results only by: [place] (if you have MyChart) OR A paper copy in the mail If you have any lab test that is abnormal or we need to change your treatment, we will call you to review the results.   Testing/Procedures: NONE ordered at this time of appointment     Follow-Up: At [facility], you and your health needs are our priority.  As part of our continuing mission to provide you with exceptional heart care, we have created designated Provider Care Teams.  These Care Teams include your primary Cardiologist (physician) and Advanced Practice Providers (APPs -  Physician Assistants and Nurse Practitioners) who all work together to provide you with the care you need, when you need it.  We recommend signing up for the patient portal called "MyChart".  Sign up information is provided on this After Visit Summary.  MyChart is used to connect with patients for Virtual Visits (Telemedicine).  Patients are able to view lab/test results, encounter notes, upcoming appointments, etc.  Non-urgent messages can be sent to your provider as well.   To learn more about what you can do with MyChart, go to [email or web address].    Your next appointment:   1 year(s) (Meghan Hoffman) 1-2 months Meghan Hoffman)  Provider:   Evalina Field, MD     Other Instructions

## 2022-11-03 NOTE — Progress Notes (Signed)
Office Visit    Patient Name: Meghan Hoffman Date of Encounter: 11/03/2022  Primary Care Provider:  Crist Infante, MD Primary Cardiologist:  Evalina Field, MD  Chief Complaint    77 year old female with a history of CAD, hypertension, hyperlipidemia, aortic valve sclerosis, and hypothyroidism who presents for follow-up related to CAD.  Past Medical History    Past Medical History:  Diagnosis Date   Anemia    "Pernicious Anemia"- injections every 3 weeks -next 11-25-14   Arthritis    Breast cancer ([place])    Coronary artery disease    Depression    Family history of breast cancer 10/07/2021   H/O seasonal allergies    Hyperlipidemia    Hypertension    Hypothyroidism    PONV (postoperative nausea and vomiting)    once under general anesthesia   Past Surgical History:  Procedure Laterality Date   ABDOMINAL HYSTERECTOMY     BREAST LUMPECTOMY WITH RADIOACTIVE SEED LOCALIZATION Left 10/18/2021   Procedure: LEFT BREAST LUMPECTOMY WITH RADIOACTIVE SEED LOCALIZATION;  Surgeon: Jovita Kussmaul, MD;  Location: [facility];  Service: General;  Laterality: Left;   COLONOSCOPY WITH PROPOFOL N/A 11/24/2014   Procedure: COLONOSCOPY WITH PROPOFOL;  Surgeon: Garlan Fair, MD;  Location: [facility];  Service: Endoscopy;  Laterality: N/A;   KNEE SURGERY Left    open age 69   LAPAROSCOPY     x2 ovarian cyst   ORIF WRIST FRACTURE Right    TONSILLECTOMY     age 31    Allergies  No Known Allergies   Labs/Other Studies Reviewed    The following studies were reviewed today: Echo 05/2020: IMPRESSIONS    1. Calcified aortic valve with reduced cusp excursion but no AS by  doppler.   2. Left ventricular ejection fraction, by estimation, is 60 to 65%. The  left ventricle has normal function. The left ventricle has no regional  wall motion abnormalities. 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 Left  ventricular diastolic parameters  are consistent with Grade I diastolic  dysfunction (impaired relaxation).   3. Right ventricular systolic function is normal. The right ventricular  size is normal.   4. The mitral valve is normal in structure. Trivial mitral valve  regurgitation. No evidence of mitral stenosis.   5. The aortic valve has an indeterminant number of cusps. Aortic valve  regurgitation is not visualized. No aortic stenosis is present.   Coronary calcium scoring 05/2020:  FINDINGS: CORONARY CALCIUM SCORES:   Left Main: 60.4   LAD: 269   LCx: 85.1   RCA: 73.9   Total Agatston Score: 488   MESA database percentile: 88   AORTA MEASUREMENTS:   Ascending Aorta: 41 mm   Descending Aorta: 29 mm   OTHER FINDINGS:   Moderate calcifications throughout the aorta. Heart is normal size. Visualized lungs are clear. Imaging into the upper abdomen demonstrates no acute findings. Chest wall soft tissues are unremarkable. No acute bony abnormality.   IMPRESSION: The observed calcium score of 488 is at the percentile 88 for subjects of the same age, gender and race/ethnicity who are free of clinical cardiovascular disease and treated diabetes.   Aortic atherosclerosis.  ETT 06/2020: There was no ST segment deviation noted during stress. Blood pressure demonstrated a normal response to exercise. The patient exercised 7 minutes on a CVN-Bruce protocol achieving 8.5 METs. Achieved a max HR of 131bpm which represents 89% of age-predicted maximum. No electocardiographic evidence  of ischemia.    Recent Labs: No results found for requested labs within last 365 days.  Recent Lipid Panel    Component Value Date/Time   CHOL 169 09/07/2020 0925   TRIG 104 09/07/2020 0925   HDL 71 09/07/2020 0925   CHOLHDL 2.4 09/07/2020 0925   LDLCALC 79 09/07/2020 0925    History of Present Illness    77 year old female with above past medical history including CAD, hypertension, hyperlipidemia, aortic valve  sclerosis, and hypothyroidism.  She was referred to Dr. Audie Box by her PCP in 2021 in the setting of elevated coronary calcium score 488 (88th percentile).  Follow-up ETT in 06/2020 showed no evidence of ischemia.  Echocardiogram at that time revealed EF 60 to 65%, normal LV function, G1 DD, aortic valve sclerosis without evidence of stenosis.  She last seen in the office on 09/30/2021 and was stable from a cardiac standpoint.  She denied symptoms concerning for angina.  She presents today for follow-up.  Since her last visit she has done well from a cardiac standpoint.  She denies symptoms concerning for angina.  Her BP has been elevated lately-she thinks this is in the setting of gradual weight gain.  Otherwise, she reports feeling well.  Home Medications    Current Outpatient Medications  Medication Sig Dispense Refill   aspirin EC 81 MG tablet Take 81 mg by mouth every other day. Swallow whole.     atorvastatin (LIPITOR) 20 MG tablet Take 40 mg by mouth daily. 40 MG TOTAL DAILY     B Complex Vitamins (VITAMIN B COMPLEX IJ) Inject as directed every 14 (fourteen) days.      cholecalciferol (VITAMIN D) 1000 UNITS tablet Take 1,000 Units by mouth every evening.     escitalopram (LEXAPRO) 10 MG tablet Take 10 mg by mouth daily.   11   EZETIMIBE-ATORVASTATIN PO      fexofenadine (ALLEGRA) 180 MG tablet Take 180 mg by mouth every evening.     fluticasone (FLONASE) 50 MCG/ACT nasal spray Place 1 spray into both nostrils daily.     hydrochlorothiazide (HYDRODIURIL) 25 MG tablet Take 12.5 mg by mouth every morning.     letrozole (FEMARA) 2.5 MG tablet TAKE 1 TABLET BY MOUTH DAILY 90 tablet 3   levothyroxine (SYNTHROID, LEVOTHROID) 125 MCG tablet Take 125 mcg by mouth daily before breakfast.   3   lisinopril (ZESTRIL) 10 MG tablet Take 10 mg by mouth daily.     magnesium gluconate (MAGONATE) 500 MG tablet Take 500 mg by mouth daily.     potassium chloride (K-DUR,KLOR-CON) 10 MEQ tablet Take 10 mEq by  mouth daily.     TURMERIC PO Take 1 tablet by mouth 2 (two) times daily.     Zoster Vaccine Adjuvanted [facility]) injection ADM 0.5 ML IM UTD     No current facility-administered medications for this visit.     Review of Systems    She denies chest pain, palpitations, dyspnea, pnd, orthopnea, n, v, dizziness, syncope, edema, weight gain, or early satiety. All other systems reviewed and are otherwise negative except as noted above.   Physical Exam    VS:  BP (!) 142/82   Pulse 70  , BMI There is no height or weight on file to calculate BMI. GEN: Well nourished, well developed, in no acute distress. HEENT: normal. Neck: Supple, no JVD, carotid bruits, or masses. Cardiac: RRR, 2/6 murmusr,no rubs, or gallops. No clubbing, cyanosis, edema.  Radials/DP/PT 2+ and equal bilaterally.  Respiratory:  Respirations regular and unlabored, clear to auscultation bilaterally. GI: Soft, nontender, nondistended, BS + x 4. MS: no deformity or atrophy. Skin: warm and dry, no rash. Neuro:  Strength and sensation are intact. Psych: Normal affect.  Accessory Clinical Findings    ECG personally reviewed by me today -NSR, 70 bpm- no acute changes.   Lab Results  Component Value Date   WBC 5.3 10/06/2021   HGB 14.1 10/06/2021   HCT 42.2 10/06/2021   MCV 94.6 10/06/2021   PLT 219 10/06/2021   Lab Results  Component Value Date   CREATININE 0.63 10/06/2021   BUN 18 10/06/2021   NA 138 10/06/2021   K 3.7 10/06/2021   CL 100 10/06/2021   CO2 31 10/06/2021   Lab Results  Component Value Date   ALT 13 10/06/2021   AST 19 10/06/2021   ALKPHOS 72 10/06/2021   BILITOT 0.7 10/06/2021   Lab Results  Component Value Date   CHOL 169 09/07/2020   HDL 71 09/07/2020   LDLCALC 79 09/07/2020   TRIG 104 09/07/2020   CHOLHDL 2.4 09/07/2020    No results found for: "HGBA1C"  Assessment & Plan    1. CAD: Stable with no anginal symptoms. No indication for ischemic evaluation.  Continue aspirin,  lisinopril, hydrochlorothiazide, Lipitor.  2. Hypertension: BP elevated above goal in office today. Pt states it has been elevated at home recently. She thinks this is in the setting of gradual weight gain. Will increase lisinopril to 10 mg daily. I will have her keep a blood pressure log, goal BP 130/80.  If BP remains elevated above goal consider transition from hydrochlorothiazide to chlorthalidone vs transition from ACE to ARB.    3. Hyperlipidemia: LDL was 60 in 09/2022. Monitored and managed per PCP. Continue Lipitor.   4. Aortic valve sclerosis: Echo showed EF 60 to 65%, normal LV function, G1 DD, aortic valve sclerosis without evidence of stenosis.  She does have a murmur on exam.  Consider repeat echo in 1 to 2 years.   5. Disposition: Follow up in 1-2 months with APP, follow-up in 1 year with Dr. Audie Box.   HYPERTENSION CONTROL Vitals:   11/03/22 0902 11/03/22 0943  BP: (!) 174/90 (!) 142/82    The patient's blood pressure is elevated above target today.  In order to address the patient's elevated BP: A current anti-hypertensive medication was adjusted today.; Blood pressure will be monitored at home to determine if medication changes need to be made.; Follow up with general cardiology has been recommended.     Lenna Sciara, NP 11/03/2022, 10:41 AM

## 2022-12-26 ENCOUNTER — Ambulatory Visit: Payer: Medicare Other | Attending: Nurse Practitioner | Admitting: Nurse Practitioner

## 2022-12-26 ENCOUNTER — Encounter: Payer: Self-pay | Admitting: Nurse Practitioner

## 2022-12-26 VITALS — BP 110/70 | HR 80 | Ht 65.0 in | Wt 178.0 lb

## 2022-12-26 DIAGNOSIS — I2510 Atherosclerotic heart disease of native coronary artery without angina pectoris: Secondary | ICD-10-CM

## 2022-12-26 DIAGNOSIS — I10 Essential (primary) hypertension: Secondary | ICD-10-CM | POA: Diagnosis not present

## 2022-12-26 DIAGNOSIS — E785 Hyperlipidemia, unspecified: Secondary | ICD-10-CM | POA: Diagnosis not present

## 2022-12-26 DIAGNOSIS — I358 Other nonrheumatic aortic valve disorders: Secondary | ICD-10-CM

## 2022-12-26 NOTE — Patient Instructions (Signed)
Medication Instructions:  Increase Hydrochlorothiazide 25 mg daily.  *If you need a refill on your cardiac medications before your next appointment, please call your pharmacy*   Lab Work: Your physician recommends that you complete lab work today. BMET  If you have labs (blood work) drawn today and your tests are completely normal, you will receive your results only by: MyChart Message (if you have MyChart) OR A paper copy in the mail If you have any lab test that is abnormal or we need to change your treatment, we will call you to review the results.   Testing/Procedures: NONE ordered at this time of appointment     Follow-Up: At [facility], you and your health needs are our priority.  As part of our continuing mission to provide you with exceptional heart care, we have created designated Provider Care Teams.  These Care Teams include your primary Cardiologist (physician) and Advanced Practice Providers (APPs -  Physician Assistants and Nurse Practitioners) who all work together to provide you with the care you need, when you need it.  We recommend signing up for the patient portal called "MyChart".  Sign up information is provided on this After Visit Summary.  MyChart is used to connect with patients for Virtual Visits (Telemedicine).  Patients are able to view lab/test results, encounter notes, upcoming appointments, etc.  Non-urgent messages can be sent to your provider as well.   To learn more about what you can do with MyChart, go to [email or web address].    Your next appointment:    10/2023 (Recall placed)  Provider:   Reatha Harps, MD     Other Instructions

## 2022-12-26 NOTE — Progress Notes (Addendum)
Office Visit    Patient Name: Meghan Hoffman Date of Encounter: 12/26/2022  Primary Care Provider:  Rodrigo Ran, MD Primary Cardiologist:  Reatha Harps, MD  Chief Complaint    77 year old female with a history of CAD, hypertension, hyperlipidemia, aortic valve sclerosis, and hypothyroidism who presents for follow-up related to CAD.   Past Medical History    Past Medical History:  Diagnosis Date   Anemia    "Pernicious Anemia"- injections every 3 weeks -next 11-25-14   Arthritis    Breast cancer    Coronary artery disease    Depression    Family history of breast cancer 10/07/2021   H/O seasonal allergies    Hyperlipidemia    Hypertension    Hypothyroidism    PONV (postoperative nausea and vomiting)    once under general anesthesia   Past Surgical History:  Procedure Laterality Date   ABDOMINAL HYSTERECTOMY     BREAST LUMPECTOMY WITH RADIOACTIVE SEED LOCALIZATION Left 10/18/2021   Procedure: LEFT BREAST LUMPECTOMY WITH RADIOACTIVE SEED LOCALIZATION;  Surgeon: Griselda Miner, MD;  Location: [facility];  Service: General;  Laterality: Left;   COLONOSCOPY WITH PROPOFOL N/A 11/24/2014   Procedure: COLONOSCOPY WITH PROPOFOL;  Surgeon: Charolett Bumpers, MD;  Location: [facility];  Service: Endoscopy;  Laterality: N/A;   KNEE SURGERY Left    open age 54   LAPAROSCOPY     x2 ovarian cyst   ORIF WRIST FRACTURE Right    TONSILLECTOMY     age 89    Allergies  No Known Allergies   Labs/Other Studies Reviewed    The following studies were reviewed today: Echo 05/2020: IMPRESSIONS    1. Calcified aortic valve with reduced cusp excursion but no AS by  doppler.   2. Left ventricular ejection fraction, by estimation, is 60 to 65%. The  left ventricle has normal function. The left ventricle has no regional  wall motion abnormalities. 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 Left  ventricular diastolic parameters are  consistent with Grade I diastolic  dysfunction (impaired relaxation).   3. Right ventricular systolic function is normal. The right ventricular  size is normal.   4. The mitral valve is normal in structure. Trivial mitral valve  regurgitation. No evidence of mitral stenosis.   5. The aortic valve has an indeterminant number of cusps. Aortic valve  regurgitation is not visualized. No aortic stenosis is present.   Coronary calcium scoring 05/2020:  FINDINGS: CORONARY CALCIUM SCORES:   Left Main: 60.4   LAD: 269   LCx: 85.1   RCA: 73.9   Total Agatston Score: 488   MESA database percentile: 88   AORTA MEASUREMENTS:   Ascending Aorta: 41 mm   Descending Aorta: 29 mm   OTHER FINDINGS:   Moderate calcifications throughout the aorta. Heart is normal size. Visualized lungs are clear. Imaging into the upper abdomen demonstrates no acute findings. Chest wall soft tissues are unremarkable. No acute bony abnormality.   IMPRESSION: The observed calcium score of 488 is at the percentile 88 for subjects of the same age, gender and race/ethnicity who are free of clinical cardiovascular disease and treated diabetes.   Aortic atherosclerosis.   ETT 06/2020: There was no ST segment deviation noted during stress. Blood pressure demonstrated a normal response to exercise. The patient exercised 7 minutes on a CVN-Bruce protocol achieving 8.5 METs. Achieved a max HR of 131bpm which represents 89% of age-predicted maximum. No electocardiographic  evidence of ischemia.      Recent Labs: No results found for requested labs within last 365 days.  Recent Lipid Panel    Component Value Date/Time   CHOL 169 09/07/2020 0925   TRIG 104 09/07/2020 0925   HDL 71 09/07/2020 0925   CHOLHDL 2.4 09/07/2020 0925   LDLCALC 79 09/07/2020 0925    History of Present Illness    77 year old female with above past medical history including CAD, hypertension, hyperlipidemia, aortic valve  sclerosis, and hypothyroidism.   She was referred to Dr. Flora Lipps by her PCP in 2021 in the setting of elevated coronary calcium score 488 (88th percentile).  Follow-up ETT in 06/2020 showed no evidence of ischemia.  Echocardiogram at that time revealed EF 60 to 65%, normal LV function, G1 DD, aortic valve sclerosis without evidence of stenosis.  She last seen in the office on 11/03/2022 and was stable from a cardiac standpoint.  She denied symptoms concerning for angina.  Her BP was mildly elevated.  Lisinopril was increased to 10 mg daily.   She presents today for follow-up. Since her last visit she has done well from a cardiac standpoint.  She continues to have ongoing elevated blood pressure despite increase in lisinopril so she increased her hydrochlorothiazide to 25 mg daily.  BP has been well-controlled since this change.  She denies any symptoms concerning for angina, denies dyspnea, palpitations, edema, PND, orthopnea, weight gain.  Overall, she reports feeling well.  Home Medications    Current Outpatient Medications  Medication Sig Dispense Refill   aspirin EC 81 MG tablet Take 81 mg by mouth every other day. Swallow whole.     atorvastatin (LIPITOR) 20 MG tablet Take 40 mg by mouth daily. 40 MG TOTAL DAILY     B Complex Vitamins (VITAMIN B COMPLEX IJ) Inject as directed every 14 (fourteen) days.      cholecalciferol (VITAMIN D) 1000 UNITS tablet Take 1,000 Units by mouth every evening.     escitalopram (LEXAPRO) 10 MG tablet Take 10 mg by mouth daily.   11   EZETIMIBE-ATORVASTATIN PO      fexofenadine (ALLEGRA) 180 MG tablet Take 180 mg by mouth every evening.     fluticasone (FLONASE) 50 MCG/ACT nasal spray Place 1 spray into both nostrils daily.     hydrochlorothiazide (HYDRODIURIL) 25 MG tablet Take 25 mg by mouth every morning.     letrozole (FEMARA) 2.5 MG tablet TAKE 1 TABLET BY MOUTH DAILY 90 tablet 3   levothyroxine (SYNTHROID, LEVOTHROID) 125 MCG tablet Take 125 mcg by mouth  daily before breakfast.   3   lisinopril (ZESTRIL) 10 MG tablet Take 10 mg by mouth daily.     magnesium gluconate (MAGONATE) 500 MG tablet Take 500 mg by mouth daily.     Phentermine-Topiramate (QSYMIA) 11.25-69 MG CP24 Take by mouth daily.     potassium chloride (K-DUR,KLOR-CON) 10 MEQ tablet Take 10 mEq by mouth daily.     TURMERIC PO Take 1 tablet by mouth 2 (two) times daily.     Zoster Vaccine Adjuvanted [facility]) injection ADM 0.5 ML IM UTD     No current facility-administered medications for this visit.     Review of Systems    She denies chest pain, palpitations, dyspnea, pnd, orthopnea, n, v, dizziness, syncope, edema, weight gain, or early satiety. All other systems reviewed and are otherwise negative except as noted above.   Physical Exam    VS:  BP 110/70   Pulse 80  Ht  (1.651 m)   Wt 178 lb (80.7 kg)   SpO2 98%   BMI 29.62 kg/m   GEN: Well nourished, well developed, in no acute distress. HEENT: normal. Neck: Supple, no JVD, carotid bruits, or masses. Cardiac: RRR, 2/6 murmur, no rubs, or gallops. No clubbing, cyanosis, edema.  Radials/DP/PT 2+ and equal bilaterally.  Respiratory:  Respirations regular and unlabored, clear to auscultation bilaterally. GI: Soft, nontender, nondistended, BS + x 4. MS: no deformity or atrophy. Skin: warm and dry, no rash. Neuro:  Strength and sensation are intact. Psych: Normal affect.  Accessory Clinical Findings    ECG personally reviewed by me today - No EKG in office today.    Lab Results  Component Value Date   WBC 5.3 10/06/2021   HGB 14.1 10/06/2021   HCT 42.2 10/06/2021   MCV 94.6 10/06/2021   PLT 219 10/06/2021   Lab Results  Component Value Date   CREATININE 0.63 10/06/2021   BUN 18 10/06/2021   NA 138 10/06/2021   K 3.7 10/06/2021   CL 100 10/06/2021   CO2 31 10/06/2021   Lab Results  Component Value Date   ALT 13 10/06/2021   AST 19 10/06/2021   ALKPHOS 72 10/06/2021   BILITOT 0.7 10/06/2021    Lab Results  Component Value Date   CHOL 169 09/07/2020   HDL 71 09/07/2020   LDLCALC 79 09/07/2020   TRIG 104 09/07/2020   CHOLHDL 2.4 09/07/2020    No results found for: "HGBA1C"  Assessment & Plan    1. CAD: Stable with no anginal symptoms. No indication for ischemic evaluation.  Continue aspirin, lisinopril, hydrochlorothiazide, Lipitor.   2. Hypertension: BP well controlled.  She is tolerating increased lisinopril dosing.  She did have ongoing elevated BP and so she self increased her hydrochlorothiazide to 25 mg daily.  Will reflect this change on her medication list.  Will update BMET today.  Continue current antihypertensive regimen.   3. Hyperlipidemia: LDL was 69 in 09/2022. Monitored and managed per PCP. Continue Lipitor.    4. Aortic valve sclerosis: Echo in 2021 showed EF 60 to 65%, normal LV function, G1 DD, aortic valve sclerosis without evidence of stenosis.  She does have a murmur on exam.  Consider repeat echo in 1 to 2 years.    5. Disposition: Follow up per recall in 10/2023 with Dr. Flora Lipps.       Joylene Grapes, NP 12/26/2022, 9:20 AM

## 2022-12-27 LAB — BASIC METABOLIC PANEL
BUN/Creatinine Ratio: 23 (ref 12–28)
BUN: 15 mg/dL (ref 8–27)
CO2: 24 mmol/L (ref 20–29)
Calcium: 9.5 mg/dL (ref 8.7–10.3)
Chloride: 102 mmol/L (ref 96–106)
Creatinine, Ser: 0.64 mg/dL (ref 0.57–1.00)
Glucose: 88 mg/dL (ref 70–99)
Potassium: 4.6 mmol/L (ref 3.5–5.2)
Sodium: 141 mmol/L (ref 134–144)
eGFR: 92 mL/min/{1.73_m2} (ref >59)

## 2022-12-28 ENCOUNTER — Ambulatory Visit: Payer: Medicare Other | Admitting: Cardiovascular Disease

## 2022-12-28 ENCOUNTER — Telehealth: Payer: Self-pay

## 2022-12-28 NOTE — Telephone Encounter (Signed)
Spoke with pt. Pt was notified of lab results and recommendations. Pt will continue her current medication and f/u as planned.  

## 2023-05-19 ENCOUNTER — Other Ambulatory Visit: Payer: Self-pay | Admitting: Internal Medicine

## 2023-05-19 ENCOUNTER — Other Ambulatory Visit: Payer: Self-pay | Admitting: Adult Health

## 2023-05-19 ENCOUNTER — Ambulatory Visit
Admission: RE | Admit: 2023-05-19 | Discharge: 2023-05-19 | Disposition: A | Discharge status: Home / Self Care | Payer: Medicare Other | Source: Ambulatory Visit | Attending: Adult Health

## 2023-05-19 DIAGNOSIS — S0990XA Unspecified injury of head, initial encounter: Secondary | ICD-10-CM

## 2023-05-19 DIAGNOSIS — S0592XA Unspecified injury of left eye and orbit, initial encounter: Secondary | ICD-10-CM

## 2023-05-19 MED ORDER — IOPAMIDOL (ISOVUE-300) INJECTION 61%
200.0000 mL | Freq: Once | INTRAVENOUS | Status: AC | PRN
  Administered 2023-05-19: 75 mL via INTRAVENOUS

## 2023-05-22 ENCOUNTER — Ambulatory Visit
Admission: RE | Admit: 2023-05-22 | Discharge: 2023-05-22 | Disposition: A | Discharge status: Home / Self Care | Payer: Medicare Other | Source: Ambulatory Visit | Attending: Internal Medicine | Admitting: Internal Medicine

## 2023-05-22 DIAGNOSIS — S0592XA Unspecified injury of left eye and orbit, initial encounter: Secondary | ICD-10-CM

## 2023-05-22 DIAGNOSIS — S0990XA Unspecified injury of head, initial encounter: Secondary | ICD-10-CM

## 2023-06-30 ENCOUNTER — Other Ambulatory Visit: Payer: Self-pay | Admitting: Hematology and Oncology

## 2023-07-04 ENCOUNTER — Other Ambulatory Visit: Payer: Self-pay | Admitting: Internal Medicine

## 2023-07-04 DIAGNOSIS — Z9889 Other specified postprocedural states: Secondary | ICD-10-CM

## 2023-08-07 ENCOUNTER — Inpatient Hospital Stay: Payer: Medicare Other | Attending: Hematology and Oncology | Admitting: Hematology and Oncology

## 2023-08-07 VITALS — BP 144/71 | HR 78 | Temp 97.4°F | Resp 18 | Ht 65.0 in | Wt 163.2 lb

## 2023-08-07 DIAGNOSIS — Z79811 Long term (current) use of aromatase inhibitors: Secondary | ICD-10-CM | POA: Insufficient documentation

## 2023-08-07 DIAGNOSIS — C50412 Malignant neoplasm of upper-outer quadrant of left female breast: Secondary | ICD-10-CM | POA: Insufficient documentation

## 2023-08-07 DIAGNOSIS — Z170 Estrogen receptor positive status [ER+]: Secondary | ICD-10-CM | POA: Diagnosis not present

## 2023-08-07 MED ORDER — IBANDRONATE SODIUM 150 MG PO TABS: 150.0000 mg | ORAL_TABLET | ORAL | Status: AC

## 2023-08-07 NOTE — Assessment & Plan Note (Addendum)
09/28/2021:Screening detected left breast masses.  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  Patient decided not to do radiation.   2. Adjuvant antiestrogen therapy with letrozole 2.5 mg daily x5 years started 11/01/2021   Letrozole toxicities: Denies any hot flashes.  Mild joint stiffness.   Breast cancer surveillance: Breast exam 08/07/2023: Benign Mammogram and bone density: 08/17/2022: Benign breast density category B, bone density T-score -2.2: Osteopenia currently on calcium and vitamin D.  She is currently on bone.    Return to clinic in 1 year for follow-up

## 2023-08-07 NOTE — Progress Notes (Signed)
Patient Care Team: Rodrigo Ran, MD as PCP - General (Internal Medicine) [name], Ronnald Ramp, MD as PCP - Cardiology (Cardiology) Griselda Miner, MD as Consulting Physician (General Surgery) Serena Croissant, MD as Consulting Physician (Hematology and Oncology) Dorothy Puffer, MD as Consulting Physician (Radiation Oncology)  DIAGNOSIS:  Encounter Diagnosis  Name Primary?   Malignant neoplasm of upper-outer quadrant of left breast in female, estrogen receptor positive (HCC) Yes    SUMMARY OF ONCOLOGIC HISTORY: Oncology History  Malignant neoplasm of upper-outer quadrant of left breast in female, estrogen receptor positive (HCC)  09/28/2021 Initial Diagnosis   Screening detected left breast masses.  On diagnostic mammogram only 1 persistent at 2 o'clock position 0.4 cm, axilla negative, ultrasound biopsy: Grade 2 IDC ER 100%, PR 100%, HER2 negative, Ki-67 10%   10/06/2021 Cancer Staging   Staging form: Breast, AJCC 8th Edition - Clinical stage from 10/06/2021: Stage IA (cT1a, cN0, cM0, G2, ER+, PR+, HER2-) - Signed by Serena Croissant, MD on 10/06/2021 Stage prefix: Initial diagnosis Histologic grading system: 3 grade system   10/18/2021 Surgery   Left lumpectomy: Biopsy changes, no residual cancer   10/21/2021 Genetic Testing   Negative hereditary cancer genetic testing: no pathogenic variants detected in [facility] CancerNext-Expanded +RNAinsight Panel.  Variants of uncertain significance detected in MSH3 at  p.D185H (c.553G>C) and SDHA at p.P372R (c.1115C>G).  The report date is October 21, 2021.   The CancerNext-Expanded gene panel offered by [facility] and includes sequencing, rearrangement, and RNA analysis for the following 77 genes: AIP, ALK, APC, ATM, AXIN2, BAP1, BARD1, BLM, BMPR1A, BRCA1, BRCA2, BRIP1, CDC73, CDH1, CDK4, CDKN1B, CDKN2A, CHEK2, CTNNA1, DICER1, FANCC, FH, FLCN, GALNT12, KIF1B, LZTR1, MAX, MEN1, MET, MLH1, MSH2, MSH3, MSH6, MUTYH, NBN, NF1, NF2, NTHL1, PALB2, PHOX2B, PMS2,  POT1, PRKAR1A, PTCH1, PTEN, RAD51C, RAD51D, RB1, RECQL, RET, SDHA, SDHAF2, SDHB, SDHC, SDHD, SMAD4, SMARCA4, SMARCB1, SMARCE1, STK11, SUFU, TMEM127, TP53, TSC1, TSC2, VHL and XRCC2 (sequencing and deletion/duplication); EGFR, EGLN1, HOXB13, KIT, MITF, PDGFRA, POLD1, and POLE (sequencing only); EPCAM and GREM1 (deletion/duplication only).    11/2021 -  Anti-estrogen oral therapy   Letrozole daily     CHIEF COMPLIANT: Follow-up on letrozole therapy  HISTORY OF PRESENT ILLNESS:  History of Present Illness   The patient, with a history of breast cancer, is two years into her treatment journey. She is currently on letrozole, which she tolerates well. She reports minor hot flashes and joint stiffness, which she attributes to aging. She is also on Boniva for bone density. The patient tolerates Boniva well  The patient also has a history of pernicious anemia and receives B12 shots for this condition. She has not been taking aspirin and is due for a mammogram. She has not had a breast exam with Dr. Waynard Edwards this year. The patient also reports using a CBD product for a family member's cracked ribs, which she finds effective.         ALLERGIES:  has No Known Allergies.  MEDICATIONS:  Current Outpatient Medications  Medication Sig Dispense Refill   ibandronate (BONIVA) 150 MG tablet Take 1 tablet (150 mg total) by mouth every 30 (thirty) days. Take in the morning with a full glass of water, on an empty stomach, and do not take anything else by mouth or lie down for the next 30 min.     atorvastatin (LIPITOR) 20 MG tablet Take 40 mg by mouth daily. 40 MG TOTAL DAILY     cholecalciferol (VITAMIN D) 1000 UNITS tablet Take 1,000 Units  by mouth every evening.     escitalopram (LEXAPRO) 10 MG tablet Take 10 mg by mouth daily.   11   EZETIMIBE-ATORVASTATIN PO      fexofenadine (ALLEGRA) 180 MG tablet Take 180 mg by mouth every evening.     fluticasone (FLONASE) 50 MCG/ACT nasal spray Place 1 spray into both  nostrils daily.     hydrochlorothiazide (HYDRODIURIL) 25 MG tablet Take 25 mg by mouth every morning.     letrozole (FEMARA) 2.5 MG tablet TAKE 1 TABLET BY MOUTH DAILY 100 tablet 2   levothyroxine (SYNTHROID, LEVOTHROID) 125 MCG tablet Take 125 mcg by mouth daily before breakfast.   3   lisinopril (ZESTRIL) 10 MG tablet Take 10 mg by mouth daily.     magnesium gluconate (MAGONATE) 500 MG tablet Take 500 mg by mouth daily.     Phentermine-Topiramate (QSYMIA) 11.25-69 MG CP24 Take by mouth daily.     potassium chloride (K-DUR,KLOR-CON) 10 MEQ tablet Take 10 mEq by mouth daily.     TURMERIC PO Take 1 tablet by mouth 2 (two) times daily.     No current facility-administered medications for this visit.    PHYSICAL EXAMINATION: ECOG PERFORMANCE STATUS: 1 - Symptomatic but completely ambulatory  Vitals:   08/07/23 1022  BP: (!) 144/71  Pulse: 78  Resp: 18  Temp: (!) 97.4 F (36.3 C)  SpO2: 99%   Filed Weights   08/07/23 1022  Weight: 163 lb 3.2 oz (74 kg)    Physical Exam no palpable lumps or nodules in bilateral breasts or axilla        (exam performed in the presence of a chaperone)  LABORATORY DATA:  I have reviewed the data as listed    Latest Ref Rng & Units 12/26/2022    9:26 AM 10/06/2021   12:19 PM 05/14/2008    1:48 PM  CMP  Glucose 70 - 99 mg/dL 88  82  99   BUN 8 - 27 mg/dL 15  18  16    Creatinine 0.57 - 1.00 mg/dL 8.41  3.24  4.01   Sodium 134 - 144 mmol/L 141  138  139   Potassium 3.5 - 5.2 mmol/L 4.6  3.7  3.3   Chloride 96 - 106 mmol/L 102  100  102   CO2 20 - 29 mmol/L 24  31  31    Calcium 8.7 - 10.3 mg/dL 9.5  9.9  9.0   Total Protein 6.5 - 8.1 g/dL  7.5  6.4   Total Bilirubin 0.3 - 1.2 mg/dL  0.7  1.0   Alkaline Phos 38 - 126 U/L  72  46   AST 15 - 41 U/L  19  29   ALT 0 - 44 U/L  13  23     Lab Results  Component Value Date   WBC 5.3 10/06/2021   HGB 14.1 10/06/2021   HCT 42.2 10/06/2021   MCV 94.6 10/06/2021   PLT 219 10/06/2021   NEUTROABS 2.9  10/06/2021    ASSESSMENT & PLAN:  Malignant neoplasm of upper-outer quadrant of left breast in female, estrogen receptor positive (HCC) 09/28/2021:Screening detected left breast masses.  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  Patient decided not to do radiation.   2. Adjuvant antiestrogen therapy with  letrozole 2.5 mg daily x5 years started 11/01/2021   Letrozole toxicities: Denies any hot flashes.  Mild joint stiffness.   Breast cancer surveillance: Breast exam 08/07/2023: Benign Mammogram and bone density: 08/17/2022: Benign breast density category B, bone density T-score -2.2: Osteopenia currently on calcium and vitamin D.  She is currently on bone.    Return to clinic in 1 year for follow-up     No orders of the defined types were placed in this encounter.  The patient has a good understanding of the overall plan. she agrees with it. she will call with any problems that may develop before the next visit here. Total time spent: 30 mins including face to face time and time spent for planning, charting and co-ordination of care   Tamsen Meek, MD 08/07/23

## 2023-08-21 ENCOUNTER — Ambulatory Visit
Admission: RE | Admit: 2023-08-21 | Discharge: 2023-08-21 | Disposition: A | Discharge status: Home / Self Care | Payer: Medicare Other | Source: Ambulatory Visit | Attending: Internal Medicine | Admitting: Internal Medicine

## 2023-08-21 DIAGNOSIS — Z9889 Other specified postprocedural states: Secondary | ICD-10-CM

## 2023-11-09 ENCOUNTER — Encounter: Payer: Self-pay | Admitting: Nurse Practitioner

## 2023-11-09 ENCOUNTER — Ambulatory Visit: Payer: Medicare Other | Attending: Nurse Practitioner | Admitting: Nurse Practitioner

## 2023-11-09 VITALS — BP 128/80 | HR 68 | Ht 64.0 in | Wt 163.4 lb

## 2023-11-09 DIAGNOSIS — I358 Other nonrheumatic aortic valve disorders: Secondary | ICD-10-CM | POA: Diagnosis not present

## 2023-11-09 DIAGNOSIS — I2510 Atherosclerotic heart disease of native coronary artery without angina pectoris: Secondary | ICD-10-CM | POA: Diagnosis not present

## 2023-11-09 DIAGNOSIS — E785 Hyperlipidemia, unspecified: Secondary | ICD-10-CM

## 2023-11-09 DIAGNOSIS — I10 Essential (primary) hypertension: Secondary | ICD-10-CM

## 2023-11-09 NOTE — Progress Notes (Signed)
 Office Visit    Patient Name: Meghan Hoffman Date of Encounter: 11/09/2023  Primary Care Provider:  Rodrigo Ran, MD Primary Cardiologist:  Reatha Harps, MD  Chief Complaint    78 year old female with a history of CAD, hypertension, hyperlipidemia, aortic valve sclerosis, and hypothyroidism who presents for follow-up related to CAD and aortic sclerosis.    Past Medical History    Past Medical History:  Diagnosis Date   Anemia    "Pernicious Anemia"- injections every 3 weeks -next 11-25-14   Arthritis    Breast cancer (HCC)    Coronary artery disease    Depression    Family history of breast cancer 10/07/2021   H/O seasonal allergies    Hyperlipidemia    Hypertension    Hypothyroidism    PONV (postoperative nausea and vomiting)    once under general anesthesia   Past Surgical History:  Procedure Laterality Date   ABDOMINAL HYSTERECTOMY     BREAST LUMPECTOMY WITH RADIOACTIVE SEED LOCALIZATION Left 10/18/2021   Procedure: LEFT BREAST LUMPECTOMY WITH RADIOACTIVE SEED LOCALIZATION;  Surgeon: Griselda Miner, MD;  Location: [facility];  Service: General;  Laterality: Left;   COLONOSCOPY WITH PROPOFOL N/A 11/24/2014   Procedure: COLONOSCOPY WITH PROPOFOL;  Surgeon: Charolett Bumpers, MD;  Location: [facility];  Service: Endoscopy;  Laterality: N/A;   KNEE SURGERY Left    open age 62   LAPAROSCOPY     x2 ovarian cyst   ORIF WRIST FRACTURE Right    TONSILLECTOMY     age 39    Allergies  No Known Allergies   Labs/Other Studies Reviewed    The following studies were reviewed today:  Cardiac Studies & Procedures   ______________________________________________________________________________________________   STRESS TESTS  EXERCISE TOLERANCE TEST (ETT) 07/02/2020  Narrative  There was no ST segment deviation noted during stress.  Blood pressure demonstrated a normal response to exercise.  The patient exercised 7 minutes on a CVN-Bruce protocol  achieving 8.5 METs. Achieved a max HR of 131bpm which represents 89% of age-predicted maximum.  No electocardiographic evidence of ischemia.   ECHOCARDIOGRAM  ECHOCARDIOGRAM COMPLETE 05/08/2020  Narrative ECHOCARDIOGRAM REPORT    Patient Name:   Meghan Hoffman Date of Exam: 05/08/2020 Medical Rec #:  [id number]         Height:       65.0 in Accession #:    [id number]        Weight:       171.9 lb Date of Birth:  [birth year]         BSA:          1.855 m Patient Age:    74 years          BP:           138/83 mmHg Patient Gender: F                 HR:           65 bpm. Exam Location:  [address]  Procedure: 2D Echo, Cardiac Doppler, Color Doppler and Intracardiac Opacification Agent  Indications:    R01.1 Murmur  History:        Patient has prior history of Echocardiogram examinations, most recent 01/05/2015. Carotid bruit.  Sonographer:    Cathie Beams RCS Referring Phys: [id number] MARK PERINI  IMPRESSIONS   1. Calcified aortic valve with reduced cusp excursion but no AS by doppler. 2. Left ventricular ejection fraction, by estimation, is 60  to 65%. The left ventricle has normal function. The left ventricle has no regional wall motion abnormalities. There is severe left ventricular hypertrophy of the basal-septal segment. Left ventricular diastolic parameters are consistent with Grade I diastolic dysfunction (impaired relaxation). 3. Right ventricular systolic function is normal. The right ventricular size is normal. 4. The mitral valve is normal in structure. Trivial mitral valve regurgitation. No evidence of mitral stenosis. 5. The aortic valve has an indeterminant number of cusps. Aortic valve regurgitation is not visualized. No aortic stenosis is present.  FINDINGS Left Ventricle: Left ventricular ejection fraction, by estimation, is 60 to 65%. The left ventricle has normal function. The left ventricle has no regional wall motion abnormalities. Definity contrast agent was  given IV to delineate the left ventricular endocardial borders. The left ventricular internal cavity size was normal in size. There is severe left ventricular hypertrophy of the basal-septal segment. Left ventricular diastolic parameters are consistent with Grade I diastolic dysfunction (impaired relaxation).  Right Ventricle: The right ventricular size is normal. Right ventricular systolic function is normal.  Left Atrium: Left atrial size was normal in size.  Right Atrium: Right atrial size was normal in size.  Pericardium: There is no evidence of pericardial effusion.  Mitral Valve: The mitral valve is normal in structure. Normal mobility of the mitral valve leaflets. Mild mitral annular calcification. Trivial mitral valve regurgitation. No evidence of mitral valve stenosis.  Tricuspid Valve: The tricuspid valve is normal in structure. Tricuspid valve regurgitation is trivial. No evidence of tricuspid stenosis.  Aortic Valve: The aortic valve has an indeterminant number of cusps. Aortic valve regurgitation is not visualized. No aortic stenosis is present.  Pulmonic Valve: The pulmonic valve was normal in structure. Pulmonic valve regurgitation is trivial. No evidence of pulmonic stenosis.  Aorta: The aortic root is normal in size and structure.  Venous: The inferior vena cava was not well visualized.  Additional Comments: Calcified aortic valve with reduced cusp excursion but no AS by doppler.   LEFT VENTRICLE PLAX 2D LVIDd:         4.10 cm  Diastology LVIDs:         2.60 cm  LV e' lateral:   7.51 cm/s LV PW:         1.50 cm  LV E/e' lateral: 13.0 LV IVS:        1.90 cm  LV e' medial:    6.09 cm/s LVOT diam:     2.00 cm  LV E/e' medial:  16.0 LV SV:         72 LV SV Index:   39 LVOT Area:     3.14 cm   RIGHT VENTRICLE RV S prime:     10.80 cm/s TAPSE (M-mode): 1.6 cm RVSP:           21.7 mmHg  LEFT ATRIUM             Index       RIGHT ATRIUM LA diam:        3.60 cm  1.94 cm/m  RA Pressure: 3.00 mmHg LA Vol (A2C):   45.8 ml 24.69 ml/m LA Vol (A4C):   39.4 ml 21.24 ml/m LA Biplane Vol: 43.5 ml 23.45 ml/m AORTIC VALVE LVOT Vmax:   108.00 cm/s LVOT Vmean:  67.600 cm/s LVOT VTI:    0.229 m  AORTA Ao Root diam: 3.10 cm  MITRAL VALVE                TRICUSPID VALVE  MV Area (PHT): 2.33 cm     TR Peak grad:   18.7 mmHg MV Decel Time: 326 msec     TR Vmax:        216.00 cm/s MV E velocity: 97.40 cm/s   Estimated RAP:  3.00 mmHg MV A velocity: 123.00 cm/s  RVSP:           21.7 mmHg MV E/A ratio:  0.79 SHUNTS Systemic VTI:  0.23 m Systemic Diam: 2.00 cm  Olga Millers MD Electronically signed by Olga Millers MD Signature Date/Time: 05/08/2020/1:19:14 PM    Final      CT SCANS  CT CARDIAC SCORING (SELF PAY ONLY) 05/25/2020  Narrative CLINICAL DATA:  Family history of heart disease.  Formal smoker  EXAM: CT CARDIAC CORONARY ARTERY CALCIUM SCORE  TECHNIQUE: Non-contrast imaging through the heart was performed using prospective ECG gating. Image post processing was performed on an independent workstation, allowing for quantitative analysis of the heart and coronary arteries. Note that this exam targets the heart and the chest was not imaged in its entirety.  COMPARISON:  None.  FINDINGS: CORONARY CALCIUM SCORES:  Left Main: 60.4  LAD: 269  LCx: 85.1  RCA: 73.9  Total Agatston Score: 488  MESA database percentile: 88  AORTA MEASUREMENTS:  Ascending Aorta: 41 mm  Descending Aorta: 29 mm  OTHER FINDINGS:  Moderate calcifications throughout the aorta. Heart is normal size. Visualized lungs are clear. Imaging into the upper abdomen demonstrates no acute findings. Chest wall soft tissues are unremarkable. No acute bony abnormality.  IMPRESSION: The observed calcium score of 488 is at the percentile 88 for subjects of the same age, gender and race/ethnicity who are free of clinical cardiovascular disease and treated  diabetes.  Aortic atherosclerosis.   Electronically Signed By: Charlett Nose M.D. On: 05/25/2020 09:11     ______________________________________________________________________________________________     Recent Labs: 12/26/2022: BUN 15; Creatinine, Ser 0.64; Potassium 4.6; Sodium 141  Recent Lipid Panel    Component Value Date/Time   CHOL 169 09/07/2020 0925   TRIG 104 09/07/2020 0925   HDL 71 09/07/2020 0925   CHOLHDL 2.4 09/07/2020 0925   LDLCALC 79 09/07/2020 0925    History of Present Illness    78 year old female with above past medical history including CAD, hypertension, hyperlipidemia, aortic valve sclerosis, and hypothyroidism.   She was referred to Dr. Flora Lipps by her PCP in 2021 in the setting of elevated coronary calcium score 488 (88th percentile).  Follow-up ETT in 06/2020 showed no evidence of ischemia.  Echocardiogram at that time revealed EF 60 to 65%, normal LV function, G1 DD, aortic valve sclerosis without evidence of stenosis.  She last seen in the office on 12/26/2022 and was stable from a cardiac standpoint.  She denied symptoms concerning for angina.    She presents today for follow-up. Since her last visit she has done well from a cardiac standpoint.  She denies any symptoms concerning for angina, she denies potation's, dizziness, dyspnea, edema, PND, orthopnea, weight gain.  Overall, she reports feeling well.  Home Medications    Current Outpatient Medications  Medication Sig Dispense Refill   atorvastatin (LIPITOR) 20 MG tablet Take 40 mg by mouth daily. 40 MG TOTAL DAILY     cholecalciferol (VITAMIN D) 1000 UNITS tablet Take 1,000 Units by mouth every evening.     escitalopram (LEXAPRO) 10 MG tablet Take 10 mg by mouth daily.   11   EZETIMIBE-ATORVASTATIN PO      fexofenadine (ALLEGRA)  180 MG tablet Take 180 mg by mouth every evening.     fluticasone (FLONASE) 50 MCG/ACT nasal spray Place 1 spray into both nostrils daily.     hydrochlorothiazide  (HYDRODIURIL) 25 MG tablet Take 25 mg by mouth every morning.     ibandronate (BONIVA) 150 MG tablet Take 1 tablet (150 mg total) by mouth every 30 (thirty) days. Take in the morning with a full glass of water, on an empty stomach, and do not take anything else by mouth or lie down for the next 30 min.     letrozole (FEMARA) 2.5 MG tablet TAKE 1 TABLET BY MOUTH DAILY 100 tablet 2   levothyroxine (SYNTHROID, LEVOTHROID) 125 MCG tablet Take 125 mcg by mouth daily before breakfast. Pt doesn't take on Sun Pt takes 1/2 tablet on Wed  3   lisinopril (ZESTRIL) 10 MG tablet Take 10 mg by mouth daily.     magnesium gluconate (MAGONATE) 500 MG tablet Take 500 mg by mouth daily.     Phentermine-Topiramate (QSYMIA) 11.25-69 MG CP24 Take by mouth daily.     potassium chloride (K-DUR,KLOR-CON) 10 MEQ tablet Take 10 mEq by mouth daily.     TURMERIC PO Take 1 tablet by mouth 2 (two) times daily.     No current facility-administered medications for this visit.     Review of Systems    She denies chest pain, palpitations, dyspnea, pnd, orthopnea, n, v, dizziness, syncope, edema, weight gain, or early satiety. All other systems reviewed and are otherwise negative except as noted above.   Physical Exam    VS:  BP 128/80 (BP Location: Right Arm, Patient Position: Sitting, Cuff Size: Normal)   Pulse 68   Ht 5\' 4"  (1.626 m)   Wt 163 lb 6.4 oz (74.1 kg)   SpO2 98%   BMI 28.05 kg/m  GEN: Well nourished, well developed, in no acute distress. HEENT: normal. Neck: Supple, no JVD, carotid bruits, or masses. Cardiac: RRR, 3/6 murmur, NO rubs, or gallops. No clubbing, cyanosis, edema.  Radials/DP/PT 2+ and equal bilaterally.  Respiratory:  Respirations regular and unlabored, clear to auscultation bilaterally. GI: Soft, nontender, nondistended, BS + x 4. MS: no deformity or atrophy. Skin: warm and dry, no rash. Neuro:  Strength and sensation are intact. Psych: Normal affect.  Accessory Clinical Findings     ECG personally reviewed by me today - EKG Interpretation Date/Time:  Thursday November 09 2023 15:23:02 EST Ventricular Rate:  68 PR Interval:  200 QRS Duration:  98 QT Interval:  424 QTC Calculation: 450 R Axis:   6  Text Interpretation: Normal sinus rhythm Nonspecific T wave abnormality When compared with ECG of 12-Oct-2021 14:23, Nonspecific T wave abnormality no longer evident in Inferior leads Confirmed by Bernadene Person ([id number]) on 11/09/2023 3:53:57 PM  - no acute changes.   Lab Results  Component Value Date   WBC 5.3 10/06/2021   HGB 14.1 10/06/2021   HCT 42.2 10/06/2021   MCV 94.6 10/06/2021   PLT 219 10/06/2021   Lab Results  Component Value Date   CREATININE 0.64 12/26/2022   BUN 15 12/26/2022   NA 141 12/26/2022   K 4.6 12/26/2022   CL 102 12/26/2022   CO2 24 12/26/2022   Lab Results  Component Value Date   ALT 13 10/06/2021   AST 19 10/06/2021   ALKPHOS 72 10/06/2021   BILITOT 0.7 10/06/2021   Lab Results  Component Value Date   CHOL 169 09/07/2020   HDL 71  09/07/2020   LDLCALC 79 09/07/2020   TRIG 104 09/07/2020   CHOLHDL 2.4 09/07/2020    No results found for: "HGBA1C"  Assessment & Plan    1. CAD: Stable with no anginal symptoms.No indication for ischemic evaluation.  Continue aspirin, lisinopril, hydrochlorothiazide, Lipitor.   2. Hypertension: BP well controlled. She is tolerating increased lisinopril dosing.  She did have ongoing elevated BP and so she self increased her hydrochlorothiazide to 25 mg daily.  Will reflect this change on her medication list. Continue current antihypertensive regimen.    3. Hyperlipidemia: LDL was 69 in 09/2022. Monitored and managed per PCP. Continue Lipitor.    4. Aortic valve sclerosis: Echo in 2021 showed EF 60 to 65%, normal LV function, G1 DD, aortic valve sclerosis without evidence of stenosis.  She does have a murmur on exam.  We discussed possible repeat echocardiogram, patient prefers to defer at this time given  she is completely asymptomatic. Would recommend repeat echo in 1 year for monitoring, sooner if clinically indicated.     5. Disposition: Follow up in 1 year.       Joylene Grapes, NP 11/09/2023, 5:38 PM

## 2023-11-09 NOTE — Patient Instructions (Signed)
Medication Instructions:  Your physician recommends that you continue on your current medications as directed. Please refer to the Current Medication list given to you today.  *If you need a refill on your cardiac medications before your next appointment, please call your pharmacy*   Lab Work: NONE ordered at this time of appointment    Testing/Procedures: NONE ordered at this time of appointment     Follow-Up: At [facility], you and your health needs are our priority.  As part of our continuing mission to provide you with exceptional heart care, we have created designated Provider Care Teams.  These Care Teams include your primary Cardiologist (physician) and Advanced Practice Providers (APPs -  Physician Assistants and Nurse Practitioners) who all work together to provide you with the care you need, when you need it.  We recommend signing up for the patient portal called "MyChart".  Sign up information is provided on this After Visit Summary.  MyChart is used to connect with patients for Virtual Visits (Telemedicine).  Patients are able to view lab/test results, encounter notes, upcoming appointments, etc.  Non-urgent messages can be sent to your provider as well.   To learn more about what you can do with MyChart, go to [email or web address].    Your next appointment:   1 year(s)  Provider:   Reatha Harps, MD

## 2024-04-01 ENCOUNTER — Encounter (HOSPITAL_BASED_OUTPATIENT_CLINIC_OR_DEPARTMENT_OTHER): Payer: Self-pay

## 2024-04-01 ENCOUNTER — Emergency Department (HOSPITAL_BASED_OUTPATIENT_CLINIC_OR_DEPARTMENT_OTHER)
Admission: EM | Admit: 2024-04-01 | Discharge: 2024-04-02 | Disposition: A | Discharge status: Home / Self Care | Attending: Emergency Medicine | Admitting: Emergency Medicine

## 2024-04-01 ENCOUNTER — Other Ambulatory Visit: Payer: Self-pay

## 2024-04-01 DIAGNOSIS — M62838 Other muscle spasm: Secondary | ICD-10-CM

## 2024-04-01 DIAGNOSIS — M542 Cervicalgia: Secondary | ICD-10-CM | POA: Insufficient documentation

## 2024-04-01 DIAGNOSIS — M549 Dorsalgia, unspecified: Secondary | ICD-10-CM | POA: Insufficient documentation

## 2024-04-01 LAB — COMPREHENSIVE METABOLIC PANEL WITH GFR
ALT: 15 U/L (ref 0–44)
AST: 26 U/L (ref 15–41)
Albumin: 4.3 g/dL (ref 3.5–5.0)
Alkaline Phosphatase: 76 U/L (ref 38–126)
Anion gap: 13 (ref 5–15)
BUN: 22 mg/dL (ref 8–23)
CO2: 23 mmol/L (ref 22–32)
Calcium: 9.3 mg/dL (ref 8.9–10.3)
Chloride: 102 mmol/L (ref 98–111)
Creatinine, Ser: 0.81 mg/dL (ref 0.44–1.00)
GFR, Estimated: >60 mL/min (ref >60)
Glucose, Bld: 100 mg/dL — ABNORMAL HIGH (ref 70–99)
Potassium: 3.4 mmol/L — ABNORMAL LOW (ref 3.5–5.1)
Sodium: 138 mmol/L (ref 135–145)
Total Bilirubin: 0.4 mg/dL (ref 0.0–1.2)
Total Protein: 7 g/dL (ref 6.5–8.1)

## 2024-04-01 LAB — CBC WITH DIFFERENTIAL/PLATELET
Abs Immature Granulocytes: 0.03 K/uL (ref 0.00–0.07)
Basophils Absolute: 0 K/uL (ref 0.0–0.1)
Basophils Relative: 1 %
Eosinophils Absolute: 0.2 K/uL (ref 0.0–0.5)
Eosinophils Relative: 5 %
HCT: 39.9 % (ref 36.0–46.0)
Hemoglobin: 13.2 g/dL (ref 12.0–15.0)
Immature Granulocytes: 1 %
Lymphocytes Relative: 25 %
Lymphs Abs: 1.3 K/uL (ref 0.7–4.0)
MCH: 31.4 pg (ref 26.0–34.0)
MCHC: 33.1 g/dL (ref 30.0–36.0)
MCV: 94.8 fL (ref 80.0–100.0)
Monocytes Absolute: 0.5 K/uL (ref 0.1–1.0)
Monocytes Relative: 10 %
Neutro Abs: 2.9 K/uL (ref 1.7–7.7)
Neutrophils Relative %: 58 %
Platelets: 203 K/uL (ref 150–400)
RBC: 4.21 MIL/uL (ref 3.87–5.11)
RDW: 13.2 % (ref 11.5–15.5)
WBC: 5 K/uL (ref 4.0–10.5)
nRBC: 0 % (ref 0.0–0.2)

## 2024-04-01 LAB — TROPONIN T, HIGH SENSITIVITY: Troponin T High Sensitivity: <15 ng/L (ref <19)

## 2024-04-01 NOTE — ED Triage Notes (Signed)
 Pt presents via POV c/o neck and shoulder pain at 1915. Denies injury. Reports occurred while eating.

## 2024-04-02 LAB — MAGNESIUM: Magnesium: 2 mg/dL (ref 1.7–2.4)

## 2024-04-02 LAB — TROPONIN T, HIGH SENSITIVITY: Troponin T High Sensitivity: <15 ng/L (ref <19)

## 2024-04-02 MED ORDER — TIZANIDINE HCL 4 MG PO TABS: 4.0000 mg | ORAL_TABLET | Freq: Four times a day (QID) | ORAL | 0 refills | Status: DC | PRN

## 2024-04-02 MED ORDER — LIDOCAINE 5 % EX PTCH
1.0000 | MEDICATED_PATCH | CUTANEOUS | Status: DC
  Administered 2024-04-02: 1 via TRANSDERMAL
  Filled 2024-04-02: qty 1

## 2024-04-02 MED ORDER — TIZANIDINE HCL 2 MG PO TABS
2.0000 mg | ORAL_TABLET | Freq: Once | ORAL | Status: AC
  Administered 2024-04-02: 2 mg via ORAL
  Filled 2024-04-02: qty 1

## 2024-04-02 NOTE — Discharge Instructions (Addendum)
 Your EKG and laboratory evaluation was overall reassuring.  Your symptoms are most consistent with musculoskeletal back pain and muscle spasm.  Please follow-up with your PCP to ensure resolution.  Return for any severe worsening chest pain or severe sharp ripping tearing back pain radiating from the chest to the back.  Recommend over-the-counter lidocaine  patches.  Return for any severe back pain and fever, back pain with neurologic deficits such as weakness or numbness, urinary or fecal incontinence or new back pain and any recent fall

## 2024-04-02 NOTE — ED Provider Notes (Signed)
 [facility]MENT AT [facility] Provider Note   CSN: [id number] Arrival date & time: 04/01/24  2152     Patient presents with: Shoulder Pain   Meghan Hoffman is a 78 y.o. female.    Shoulder Pain Associated symptoms: neck pain      78 year old female with medical history significant for HTN, HLD, CAD, hypothyroidism, depression, pernicious anemia presenting to the emergency department with neck pain and bilateral shoulder pain.  The patient denies any injury or trauma to her neck or back.  She denies any recent heavy lifting or heavy exertion.  She states that while eating she developed sudden onset sharp pain in her neck that radiated to her shoulders bilaterally.  She denied any chest pain, shortness of breath.  No ripping or tearing component.  She was diaphoretic when it happened.  Symptoms came on at 1915 and have since completely resolved with only mild residual discomfort in her lower cervical spine.  No fevers or chills.  No weakness or numbness.  Prior to Admission medications   Medication Sig Start Date End Date Taking? Authorizing Provider  tiZANidine  (ZANAFLEX ) 4 MG tablet Take 1 tablet (4 mg total) by mouth every 6 (six) hours as needed for muscle spasms. 04/02/24  Yes Jerrol Agent, MD  atorvastatin (LIPITOR) 20 MG tablet Take 40 mg by mouth daily. 40 MG TOTAL DAILY    [provider]  cholecalciferol (VITAMIN D) 1000 UNITS tablet Take 1,000 Units by mouth every evening.    [provider]  escitalopram (LEXAPRO) 10 MG tablet Take 10 mg by mouth daily.  09/15/14   [provider]  EZETIMIBE-ATORVASTATIN PO  06/13/22   [provider]  fexofenadine (ALLEGRA) 180 MG tablet Take 180 mg by mouth every evening.    [provider]  fluticasone (FLONASE) 50 MCG/ACT nasal spray Place 1 spray into both nostrils daily.    [provider]  hydrochlorothiazide (HYDRODIURIL) 25 MG tablet Take 25 mg by mouth  every morning.    [provider]  ibandronate  (BONIVA ) 150 MG tablet Take 1 tablet (150 mg total) by mouth every 30 (thirty) days. Take in the morning with a full glass of water, on an empty stomach, and do not take anything else by mouth or lie down for the next 30 min. 08/07/23   Gudena, Vinay, MD  letrozole  (FEMARA ) 2.5 MG tablet TAKE 1 TABLET BY MOUTH DAILY 06/30/23   Gudena, Vinay, MD  levothyroxine (SYNTHROID, LEVOTHROID) 125 MCG tablet Take 125 mcg by mouth daily before breakfast. Pt doesn't take on Sun Pt takes 1/2 tablet on Wed 09/17/14   [provider]  lisinopril (ZESTRIL) 10 MG tablet Take 10 mg by mouth daily.    [provider]  magnesium gluconate (MAGONATE) 500 MG tablet Take 500 mg by mouth daily.    [provider]  Phentermine-Topiramate (QSYMIA) 11.25-69 MG CP24 Take by mouth daily.    [provider]  potassium chloride (K-DUR,KLOR-CON) 10 MEQ tablet Take 10 mEq by mouth daily.    [provider]  TURMERIC PO Take 1 tablet by mouth 2 (two) times daily.    [provider]    Allergies: Patient has no known allergies.    Review of Systems  Musculoskeletal:  Positive for neck pain.  All other systems reviewed and are negative.   Updated Vital Signs BP 132/86   Pulse 69   Temp 98 F (36.7 C)   Resp 15  Ht 5' 5 (1.651 m)   Wt 74.8 kg   SpO2 95%   BMI 27.46 kg/m   Physical Exam Vitals and nursing note reviewed.  Constitutional:      General: She is not in acute distress. HENT:     Head: Normocephalic and atraumatic.  Eyes:     Conjunctiva/sclera: Conjunctivae normal.     Pupils: Pupils are equal, round, and reactive to light.  Neck:     Comments: Spurling sign negative, range of motion intact without pain, mild lower cervical spine paraspinal muscular tenderness with no erythema or swelling Cardiovascular:     Rate and Rhythm: Normal rate and regular rhythm.     Pulses: Normal pulses.      Heart sounds: Normal heart sounds.  Pulmonary:     Effort: Pulmonary effort is normal. No respiratory distress.     Breath sounds: Normal breath sounds.  Abdominal:     General: There is no distension.     Tenderness: There is no guarding.  Musculoskeletal:        General: No deformity or signs of injury.     Cervical back: Neck supple.     Comments: No tenderness to palpation of the clavicles or shoulders.  No trapezius tenderness bilaterally.  No midline tenderness of the thoracic or lumbar spine.  Skin:    Findings: No lesion or rash.  Neurological:     General: No focal deficit present.     Mental Status: She is alert. Mental status is at baseline.     (all labs ordered are listed, but only abnormal results are displayed) Labs Reviewed  COMPREHENSIVE METABOLIC PANEL WITH GFR - Abnormal; Notable for the following components:      Result Value   Potassium 3.4 (*)    Glucose, Bld 100 (*)    All other components within normal limits  CBC WITH DIFFERENTIAL/PLATELET  MAGNESIUM  TROPONIN T, HIGH SENSITIVITY  TROPONIN T, HIGH SENSITIVITY    EKG: EKG Interpretation Date/Time:  Tuesday April 02 2024 00:20:01 EDT Ventricular Rate:  71 PR Interval:  204 QRS Duration:  97 QT Interval:  424 QTC Calculation: 461 R Axis:   -21  Text Interpretation: Sinus rhythm Borderline left axis deviation Borderline T abnormalities, anterior leads No significant change since last tracing Confirmed by Jerrol Agent ([id number]) on 04/02/2024 12:31:52 AM  Radiology: No results found.   Procedures   Medications Ordered in the ED  lidocaine  (LIDODERM ) 5 % 1 patch (1 patch Transdermal Patch Applied 04/02/24 0057)  tiZANidine  (ZANAFLEX ) tablet 2 mg (2 mg Oral Given 04/02/24 0125)                                    Medical Decision Making Amount and/or Complexity of Data Reviewed Labs: ordered.  Risk Prescription drug management.    78 year old female with medical history significant for HTN,  HLD, CAD, hypothyroidism, depression, pernicious anemia presenting to the emergency department with neck pain and bilateral shoulder pain.  The patient denies any injury or trauma to her neck or back.  She denies any recent heavy lifting or heavy exertion.  She states that while eating she developed sudden onset sharp pain in her neck that radiated to her shoulders bilaterally.  She denied any chest pain, shortness of breath.  No ripping or tearing component.  She was diaphoretic when it happened.  Symptoms came on at 62 and  have since completely resolved with only mild residual discomfort in her lower cervical spine.  No fevers or chills.  No weakness or numbness.  On arrival, the patient was afebrile, not tachycardic or tachypneic, BP 142/98, saturating 98% on room air.  On exam, the patient had a negative Spurling sign, some paraspinal muscular tenderness to palpation of the lower cervical spine.  Range of motion intact and without focal deficit, No tenderness of the shoulders, clavicles or scapular area.  No midline tenderness of the spine.  No recent trauma or heavy lifting, low concern for fracture.  Suspect likely muscle spasm.  Very low concern for aortic dissection, patient has symmetric pulses, no ripping or tearing chest pain radiating to the back, had been diaphoretic briefly when this happened, denied chest pain, shortness of breath, cough, no fevers or chills.  No neurologic symptoms.  The patient was administered a lidocaine  patch as well as tizanidine  for symptom management.  Her symptoms had mostly resolved upon arrival.  EKG: Sinus rhythm, nonspecific T wave changes similar to prior EKGs, no acute ischemic changes.  Labs: CBC without a leukocytosis or anemia, CMP with only mild hypokalemia to 3.4, magnesium normal, initial cardiac troponin less than 15, repeat normal.  Following the above interventions, the patient was feeling symptomatically improved.  Suspect likely musculoskeletal  etiology which has now resolved.  Advised outpatient PCP follow-up, return precautions provided     Final diagnoses:  Musculoskeletal back pain  Neck pain  Muscle spasm    ED Discharge Orders          Ordered    tiZANidine  (ZANAFLEX ) 4 MG tablet  Every 6 hours PRN        04/02/24 0147               Jerrol Agent, MD 04/02/24 0148

## 2024-04-19 ENCOUNTER — Other Ambulatory Visit: Payer: Self-pay | Admitting: Hematology and Oncology

## 2024-07-15 ENCOUNTER — Other Ambulatory Visit: Payer: Self-pay | Admitting: Internal Medicine

## 2024-07-15 DIAGNOSIS — Z853 Personal history of malignant neoplasm of breast: Secondary | ICD-10-CM

## 2024-08-05 NOTE — Assessment & Plan Note (Signed)
 09/28/2021:Screening detected left breast masses.  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  Patient decided not to do radiation.   2. Adjuvant antiestrogen therapy with letrozole  2.5 mg daily x5 years started 11/01/2021   Letrozole  toxicities: Denies any hot flashes.  Mild joint stiffness.   Breast cancer surveillance: Breast exam 08/06/2024: Benign Mammogram 08/21/2023: Benign breast density category B,  December 2023: Bone density T-score -2.2: Osteopenia currently on calcium and vitamin D.    Return to clinic in 1 year for follow-up

## 2024-08-06 ENCOUNTER — Inpatient Hospital Stay: Payer: Medicare Other | Attending: Hematology and Oncology | Admitting: Hematology and Oncology

## 2024-08-06 VITALS — BP 124/78 | HR 77 | Temp 98.3°F | Resp 18 | Ht 65.0 in | Wt 163.4 lb

## 2024-08-06 DIAGNOSIS — Z7983 Long term (current) use of bisphosphonates: Secondary | ICD-10-CM | POA: Diagnosis not present

## 2024-08-06 DIAGNOSIS — C50412 Malignant neoplasm of upper-outer quadrant of left female breast: Secondary | ICD-10-CM | POA: Insufficient documentation

## 2024-08-06 DIAGNOSIS — Z1721 Progesterone receptor positive status: Secondary | ICD-10-CM | POA: Insufficient documentation

## 2024-08-06 DIAGNOSIS — Z79811 Long term (current) use of aromatase inhibitors: Secondary | ICD-10-CM | POA: Diagnosis not present

## 2024-08-06 DIAGNOSIS — Z79899 Other long term (current) drug therapy: Secondary | ICD-10-CM | POA: Diagnosis not present

## 2024-08-06 DIAGNOSIS — Z170 Estrogen receptor positive status [ER+]: Secondary | ICD-10-CM | POA: Insufficient documentation

## 2024-08-06 DIAGNOSIS — Z1732 Human epidermal growth factor receptor 2 negative status: Secondary | ICD-10-CM | POA: Insufficient documentation

## 2024-08-06 NOTE — Progress Notes (Signed)
 Patient Care Team: Shayne Anes, MD as PCP - General (Internal Medicine) [name], Darryle Ned, MD as PCP - Cardiology (Cardiology) Curvin Deward MOULD, MD as Consulting Physician (General Surgery) Odean Potts, MD as Consulting Physician (Hematology and Oncology) Dewey Rush, MD as Consulting Physician (Radiation Oncology)  DIAGNOSIS:  Encounter Diagnosis  Name Primary?   Malignant neoplasm of upper-outer quadrant of left breast in female, estrogen receptor positive (HCC) Yes    SUMMARY OF ONCOLOGIC HISTORY: Oncology History  Malignant neoplasm of upper-outer quadrant of left breast in female, estrogen receptor positive (HCC)  09/28/2021 Initial Diagnosis   Screening detected left breast masses.  On diagnostic mammogram only 1 persistent at 2 o'clock position 0.4 cm, axilla negative, ultrasound biopsy: Grade 2 IDC ER 100%, PR 100%, HER2 negative, Ki-67 10%   10/06/2021 Cancer Staging   Staging form: Breast, AJCC 8th Edition - Clinical stage from 10/06/2021: Stage IA (cT1a, cN0, cM0, G2, ER+, PR+, HER2-) - Signed by Odean Potts, MD on 10/06/2021 Stage prefix: Initial diagnosis Histologic grading system: 3 grade system   10/18/2021 Surgery   Left lumpectomy: Biopsy changes, no residual cancer   10/21/2021 Genetic Testing   Negative hereditary cancer genetic testing: no pathogenic variants detected in [facility] CancerNext-Expanded +RNAinsight Panel.  Variants of uncertain significance detected in MSH3 at  p.D185H (c.553G>C) and SDHA at p.P372R (c.1115C>G).  The report date is October 21, 2021.   The CancerNext-Expanded gene panel offered by [facility] and includes sequencing, rearrangement, and RNA analysis for the following 77 genes: AIP, ALK, APC, ATM, AXIN2, BAP1, BARD1, BLM, BMPR1A, BRCA1, BRCA2, BRIP1, CDC73, CDH1, CDK4, CDKN1B, CDKN2A, CHEK2, CTNNA1, DICER1, FANCC, FH, FLCN, GALNT12, KIF1B, LZTR1, MAX, MEN1, MET, MLH1, MSH2, MSH3, MSH6, MUTYH, NBN, NF1, NF2, NTHL1, PALB2, PHOX2B, PMS2,  POT1, PRKAR1A, PTCH1, PTEN, RAD51C, RAD51D, RB1, RECQL, RET, SDHA, SDHAF2, SDHB, SDHC, SDHD, SMAD4, SMARCA4, SMARCB1, SMARCE1, STK11, SUFU, TMEM127, TP53, TSC1, TSC2, VHL and XRCC2 (sequencing and deletion/duplication); EGFR, EGLN1, HOXB13, KIT, MITF, PDGFRA, POLD1, and POLE (sequencing only); EPCAM and GREM1 (deletion/duplication only).    11/2021 -  Anti-estrogen oral therapy   Letrozole  daily     CHIEF COMPLIANT: Surveillance of breast cancer on letrozole  therapy  HISTORY OF PRESENT ILLNESS:  History of Present Illness Meghan Hoffman is a 78 year old female with breast cancer who presents for follow-up regarding her treatment and upcoming diagnostic procedures.  She underwent breast cancer surgery in January 2023 and is taking letrozole  without adverse effects, including no hot flashes.  Her last mammogram in December showed breast density B, and another mammogram is scheduled for this month.  A biopsy is pending and has not yet been scheduled, with ongoing confusion about how and when it will be done.  She has not had a clinical breast exam this year outside of mammograms and is scheduled for breast surgery with subcutaneous nodular resection on December 10.     ALLERGIES:  has no known allergies.  MEDICATIONS:  Current Outpatient Medications  Medication Sig Dispense Refill   atorvastatin (LIPITOR) 20 MG tablet Take 40 mg by mouth daily. 40 MG TOTAL DAILY     cholecalciferol (VITAMIN D) 1000 UNITS tablet Take 1,000 Units by mouth every evening.     escitalopram (LEXAPRO) 10 MG tablet Take 10 mg by mouth daily.   11   EZETIMIBE-ATORVASTATIN PO      fexofenadine (ALLEGRA) 180 MG tablet Take 180 mg by mouth every evening.     fluticasone (FLONASE) 50 MCG/ACT nasal spray  Place 1 spray into both nostrils daily.     hydrochlorothiazide (HYDRODIURIL) 25 MG tablet Take 25 mg by mouth every morning.     ibandronate  (BONIVA ) 150 MG tablet Take 1 tablet (150 mg total) by mouth every 30  (thirty) days. Take in the morning with a full glass of water, on an empty stomach, and do not take anything else by mouth or lie down for the next 30 min.     letrozole  (FEMARA ) 2.5 MG tablet TAKE 1 TABLET BY MOUTH DAILY 90 tablet 2   levothyroxine (SYNTHROID, LEVOTHROID) 125 MCG tablet Take 125 mcg by mouth daily before breakfast. Pt doesn't take on Sun Pt takes 1/2 tablet on Wed  3   lisinopril (ZESTRIL) 10 MG tablet Take 10 mg by mouth daily.     magnesium gluconate (MAGONATE) 500 MG tablet Take 500 mg by mouth daily.     Phentermine-Topiramate (QSYMIA) 11.25-69 MG CP24 Take by mouth daily.     potassium chloride (K-DUR,KLOR-CON) 10 MEQ tablet Take 10 mEq by mouth daily.     TURMERIC PO Take 1 tablet by mouth 2 (two) times daily.     No current facility-administered medications for this visit.    PHYSICAL EXAMINATION: ECOG PERFORMANCE STATUS: 1 - Symptomatic but completely ambulatory  Vitals:   08/06/24 1056  BP: 124/78  Pulse: 77  Resp: 18  Temp: 98.3 F (36.8 C)  SpO2: 98%   Filed Weights   08/06/24 1056  Weight: 163 lb 6.4 oz (74.1 kg)    Physical Exam BREAST: Breasts symmetrical, no masses, no tenderness  (exam performed in the presence of a chaperone)  LABORATORY DATA:  I have reviewed the data as listed    Latest Ref Rng & Units 04/01/2024   10:17 PM 12/26/2022    9:26 AM 10/06/2021   12:19 PM  CMP  Glucose 70 - 99 mg/dL 899  88  82   BUN 8 - 23 mg/dL 22  15  18    Creatinine 0.44 - 1.00 mg/dL 9.18  9.35  9.36   Sodium 135 - 145 mmol/L 138  141  138   Potassium 3.5 - 5.1 mmol/L 3.4  4.6  3.7   Chloride 98 - 111 mmol/L 102  102  100   CO2 22 - 32 mmol/L 23  24  31    Calcium 8.9 - 10.3 mg/dL 9.3  9.5  9.9   Total Protein 6.5 - 8.1 g/dL 7.0   7.5   Total Bilirubin 0.0 - 1.2 mg/dL 0.4   0.7   Alkaline Phos 38 - 126 U/L 76   72   AST 15 - 41 U/L 26   19   ALT 0 - 44 U/L 15   13     Lab Results  Component Value Date   WBC 5.0 04/01/2024   HGB 13.2 04/01/2024    HCT 39.9 04/01/2024   MCV 94.8 04/01/2024   PLT 203 04/01/2024   NEUTROABS 2.9 04/01/2024    ASSESSMENT & PLAN:  Malignant neoplasm of upper-outer quadrant of left breast in female, estrogen receptor positive (HCC) 09/28/2021:Screening detected left breast masses.  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  Patient decided not to do radiation.   2. Adjuvant antiestrogen therapy with letrozole  2.5 mg daily  x5 years started 11/01/2021   Letrozole  toxicities: Denies any hot flashes.  Mild joint stiffness.   Breast cancer surveillance: Breast exam 08/06/2024: Benign Mammogram 08/21/2023: Benign breast density category B,  December 2023: Bone density T-score -2.2: Osteopenia currently on calcium and vitamin D.    Return to clinic in 1 year for follow-up     No orders of the defined types were placed in this encounter.  The patient has a good understanding of the overall plan. she agrees with it. she will call with any problems that may develop before the next visit here.  I personally spent a total of 30 minutes in the care of the patient today including preparing to see the patient, getting/reviewing separately obtained history, performing a medically appropriate exam/evaluation, counseling and educating, placing orders, referring and communicating with other health care professionals, documenting clinical information in the EHR, independently interpreting results, communicating results, and coordinating care.   Viinay K Kobey Sides, MD 08/06/24

## 2024-08-22 ENCOUNTER — Other Ambulatory Visit (HOSPITAL_COMMUNITY): Payer: Self-pay | Admitting: Internal Medicine

## 2024-08-22 ENCOUNTER — Inpatient Hospital Stay: Admission: RE | Admit: 2024-08-22 | Discharge: 2024-08-22 | Attending: Internal Medicine | Admitting: Internal Medicine

## 2024-08-22 DIAGNOSIS — R011 Cardiac murmur, unspecified: Secondary | ICD-10-CM

## 2024-08-22 DIAGNOSIS — Z853 Personal history of malignant neoplasm of breast: Secondary | ICD-10-CM

## 2024-09-13 ENCOUNTER — Other Ambulatory Visit: Payer: Self-pay | Admitting: Hematology and Oncology

## 2024-09-27 ENCOUNTER — Ambulatory Visit (HOSPITAL_COMMUNITY)
Admission: RE | Admit: 2024-09-27 | Discharge: 2024-09-27 | Disposition: A | Source: Ambulatory Visit | Attending: Internal Medicine | Admitting: Internal Medicine

## 2024-09-27 DIAGNOSIS — R011 Cardiac murmur, unspecified: Secondary | ICD-10-CM | POA: Insufficient documentation

## 2024-09-27 MED ORDER — PERFLUTREN LIPID MICROSPHERE
3.0000 mL | INTRAVENOUS | Status: AC | PRN
  Administered 2024-09-27: 3 mL via INTRAVENOUS

## 2024-09-28 LAB — ECHOCARDIOGRAM COMPLETE
AR max vel: 1.28 cm2
AV Area VTI: 1.31 cm2
AV Area mean vel: 1.22 cm2
AV Mean grad: 14.7 mmHg
AV Peak grad: 25.6 mmHg
Ao pk vel: 2.53 m/s
Area-P 1/2: 2.08 cm2
S' Lateral: 2.5 cm

## 2025-08-06 ENCOUNTER — Inpatient Hospital Stay: Admitting: Hematology and Oncology
